# Patient Record
Sex: Female | Born: 1960 | Race: White | Hispanic: No | Marital: Married | Smoking: Never smoker
Health system: Southern US, Community
[De-identification: ages and names within clinical notes are randomized; demographics above are authoritative.]

## PROBLEM LIST (undated history)

## (undated) DIAGNOSIS — K219 Gastro-esophageal reflux disease without esophagitis: Secondary | ICD-10-CM

## (undated) DIAGNOSIS — L309 Dermatitis, unspecified: Secondary | ICD-10-CM

## (undated) DIAGNOSIS — M1990 Unspecified osteoarthritis, unspecified site: Secondary | ICD-10-CM

## (undated) DIAGNOSIS — J3089 Other allergic rhinitis: Secondary | ICD-10-CM

## (undated) DIAGNOSIS — D649 Anemia, unspecified: Secondary | ICD-10-CM

## (undated) DIAGNOSIS — F32A Depression, unspecified: Secondary | ICD-10-CM

## (undated) DIAGNOSIS — Z9889 Other specified postprocedural states: Secondary | ICD-10-CM

## (undated) DIAGNOSIS — I10 Essential (primary) hypertension: Secondary | ICD-10-CM

## (undated) DIAGNOSIS — F419 Anxiety disorder, unspecified: Secondary | ICD-10-CM

## (undated) DIAGNOSIS — R232 Flushing: Secondary | ICD-10-CM

## (undated) DIAGNOSIS — R112 Nausea with vomiting, unspecified: Secondary | ICD-10-CM

## (undated) DIAGNOSIS — F329 Major depressive disorder, single episode, unspecified: Secondary | ICD-10-CM

## (undated) DIAGNOSIS — E119 Type 2 diabetes mellitus without complications: Secondary | ICD-10-CM

## (undated) HISTORY — PX: VAGINAL HYSTERECTOMY: SUR661

## (undated) HISTORY — PX: COLONOSCOPY: SHX174

## (undated) HISTORY — PX: EYE SURGERY: SHX253

## (undated) HISTORY — PX: BACK SURGERY: SHX140

---

## 2000-11-08 ENCOUNTER — Other Ambulatory Visit: Admission: RE | Admit: 2000-11-08 | Discharge: 2000-11-08 | Payer: Self-pay | Admitting: Family Medicine

## 2003-11-25 ENCOUNTER — Inpatient Hospital Stay (HOSPITAL_COMMUNITY): Admission: RE | Admit: 2003-11-25 | Discharge: 2003-11-26 | Payer: Self-pay | Admitting: Neurosurgery

## 2004-01-10 ENCOUNTER — Encounter: Admission: RE | Admit: 2004-01-10 | Discharge: 2004-01-10 | Payer: Self-pay | Admitting: Neurosurgery

## 2004-02-28 ENCOUNTER — Encounter: Admission: RE | Admit: 2004-02-28 | Discharge: 2004-02-28 | Payer: Self-pay | Admitting: Neurosurgery

## 2004-06-29 ENCOUNTER — Ambulatory Visit: Payer: Self-pay | Admitting: Family Medicine

## 2005-11-21 ENCOUNTER — Ambulatory Visit: Payer: Self-pay | Admitting: Family Medicine

## 2008-08-25 LAB — HM PAP SMEAR: HM PAP: NORMAL

## 2008-09-08 ENCOUNTER — Ambulatory Visit: Payer: Self-pay | Admitting: Family Medicine

## 2008-11-29 ENCOUNTER — Ambulatory Visit: Payer: Self-pay | Admitting: Unknown Physician Specialty

## 2008-12-16 ENCOUNTER — Ambulatory Visit: Payer: Self-pay | Admitting: Unknown Physician Specialty

## 2009-10-27 ENCOUNTER — Ambulatory Visit: Payer: Self-pay | Admitting: Family Medicine

## 2009-10-27 LAB — HM MAMMOGRAPHY: HM MAMMO: NORMAL

## 2011-05-01 ENCOUNTER — Ambulatory Visit: Payer: Self-pay | Admitting: Unknown Physician Specialty

## 2012-07-07 LAB — HM COLONOSCOPY: HM COLON: NORMAL

## 2014-04-21 ENCOUNTER — Ambulatory Visit: Payer: Self-pay | Admitting: Family Medicine

## 2014-05-28 ENCOUNTER — Ambulatory Visit: Payer: Self-pay | Admitting: Neurosurgery

## 2014-05-28 LAB — CREATININE, SERUM
Creatinine: 0.97 mg/dL (ref 0.60–1.30)
EGFR (African American): >60
EGFR (Non-African Amer.): >60

## 2014-06-08 ENCOUNTER — Other Ambulatory Visit: Payer: Self-pay | Admitting: Neurosurgery

## 2014-06-08 ENCOUNTER — Ambulatory Visit
Admission: RE | Admit: 2014-06-08 | Discharge: 2014-06-08 | Disposition: A | Discharge status: Home / Self Care | Payer: BLUE CROSS/BLUE SHIELD | Source: Ambulatory Visit | Attending: Neurosurgery | Admitting: Neurosurgery

## 2014-06-08 DIAGNOSIS — M5416 Radiculopathy, lumbar region: Secondary | ICD-10-CM

## 2014-06-08 MED ORDER — METHYLPREDNISOLONE ACETATE 40 MG/ML INJ SUSP (RADIOLOG
120.0000 mg | Freq: Once | INTRAMUSCULAR | Status: AC
  Administered 2014-06-08: 120 mg via EPIDURAL

## 2014-06-08 MED ORDER — IOHEXOL 180 MG/ML  SOLN
1.0000 mL | Freq: Once | INTRAMUSCULAR | Status: AC | PRN
  Administered 2014-06-08: 1 mL via EPIDURAL

## 2014-06-08 NOTE — Discharge Instructions (Signed)

## 2014-07-09 ENCOUNTER — Other Ambulatory Visit: Payer: Self-pay | Admitting: Neurosurgery

## 2014-07-16 ENCOUNTER — Other Ambulatory Visit (HOSPITAL_COMMUNITY): Payer: Self-pay | Admitting: *Deleted

## 2014-07-16 ENCOUNTER — Encounter (HOSPITAL_COMMUNITY): Payer: Self-pay

## 2014-07-16 ENCOUNTER — Encounter (HOSPITAL_COMMUNITY)
Admission: RE | Admit: 2014-07-16 | Discharge: 2014-07-16 | Disposition: A | Discharge status: Home / Self Care | Payer: BLUE CROSS/BLUE SHIELD | Source: Ambulatory Visit | Attending: Neurosurgery | Admitting: Neurosurgery

## 2014-07-16 DIAGNOSIS — Z79899 Other long term (current) drug therapy: Secondary | ICD-10-CM | POA: Diagnosis not present

## 2014-07-16 DIAGNOSIS — I10 Essential (primary) hypertension: Secondary | ICD-10-CM | POA: Diagnosis not present

## 2014-07-16 DIAGNOSIS — Z01810 Encounter for preprocedural cardiovascular examination: Secondary | ICD-10-CM | POA: Insufficient documentation

## 2014-07-16 DIAGNOSIS — Z01818 Encounter for other preprocedural examination: Secondary | ICD-10-CM | POA: Insufficient documentation

## 2014-07-16 DIAGNOSIS — Z981 Arthrodesis status: Secondary | ICD-10-CM | POA: Diagnosis not present

## 2014-07-16 DIAGNOSIS — Z01812 Encounter for preprocedural laboratory examination: Secondary | ICD-10-CM | POA: Insufficient documentation

## 2014-07-16 DIAGNOSIS — M4800 Spinal stenosis, site unspecified: Secondary | ICD-10-CM | POA: Diagnosis not present

## 2014-07-16 DIAGNOSIS — Z0183 Encounter for blood typing: Secondary | ICD-10-CM | POA: Diagnosis not present

## 2014-07-16 DIAGNOSIS — K219 Gastro-esophageal reflux disease without esophagitis: Secondary | ICD-10-CM | POA: Diagnosis not present

## 2014-07-16 HISTORY — DX: Dermatitis, unspecified: L30.9

## 2014-07-16 HISTORY — DX: Major depressive disorder, single episode, unspecified: F32.9

## 2014-07-16 HISTORY — DX: Nausea with vomiting, unspecified: R11.2

## 2014-07-16 HISTORY — DX: Essential (primary) hypertension: I10

## 2014-07-16 HISTORY — DX: Other allergic rhinitis: J30.89

## 2014-07-16 HISTORY — DX: Anxiety disorder, unspecified: F41.9

## 2014-07-16 HISTORY — DX: Anemia, unspecified: D64.9

## 2014-07-16 HISTORY — DX: Unspecified osteoarthritis, unspecified site: M19.90

## 2014-07-16 HISTORY — DX: Depression, unspecified: F32.A

## 2014-07-16 HISTORY — DX: Gastro-esophageal reflux disease without esophagitis: K21.9

## 2014-07-16 HISTORY — DX: Other specified postprocedural states: Z98.890

## 2014-07-16 LAB — BASIC METABOLIC PANEL
Anion gap: 10 (ref 5–15)
BUN: 13 mg/dL (ref 6–23)
CHLORIDE: 100 mmol/L (ref 96–112)
CO2: 28 mmol/L (ref 19–32)
Calcium: 9.1 mg/dL (ref 8.4–10.5)
Creatinine, Ser: 0.89 mg/dL (ref 0.50–1.10)
GFR, EST AFRICAN AMERICAN: 84 mL/min — AB (ref >90)
GFR, EST NON AFRICAN AMERICAN: 73 mL/min — AB (ref >90)
Glucose, Bld: 127 mg/dL — ABNORMAL HIGH (ref 70–99)
Potassium: 3.1 mmol/L — ABNORMAL LOW (ref 3.5–5.1)
Sodium: 138 mmol/L (ref 135–145)

## 2014-07-16 LAB — CBC
HCT: 40.4 % (ref 36.0–46.0)
HEMOGLOBIN: 13.7 g/dL (ref 12.0–15.0)
MCH: 28.9 pg (ref 26.0–34.0)
MCHC: 33.9 g/dL (ref 30.0–36.0)
MCV: 85.2 fL (ref 78.0–100.0)
Platelets: 233 10*3/uL (ref 150–400)
RBC: 4.74 MIL/uL (ref 3.87–5.11)
RDW: 13.3 % (ref 11.5–15.5)
WBC: 8.5 10*3/uL (ref 4.0–10.5)

## 2014-07-16 LAB — SURGICAL PCR SCREEN
MRSA, PCR: NEGATIVE
Staphylococcus aureus: NEGATIVE

## 2014-07-16 LAB — TYPE AND SCREEN
ABO/RH(D): A NEG
Antibody Screen: NEGATIVE

## 2014-07-16 LAB — ABO/RH: ABO/RH(D): A NEG

## 2014-07-16 NOTE — Pre-Procedure Instructions (Signed)
Cheryl Yang  07/16/2014   Your procedure is scheduled on:  Tuesday, July 20, 2014 at 7:30 AM.   Report to [facility] Entrance "A" Admitting  at 5:30 AM.   Call this number if you have problems the morning of surgery: [phone]               Any questions prior to day of surgery, please call [phone] between 8 & 4 PM.   Remember:   Do not eat food or drink liquids after midnight Monday, 07/19/14.   Take these medicines the morning of surgery with A SIP OF WATER: Fexofenadine (Allegra), Gabapentin (Neurontin), Sertraline (Zoloft), Omeprezole (Prilosec) - if needed   Stop CoQ10 as of today.   Do not wear jewelry, make-up or nail polish.  Do not wear lotions, powders, or perfumes. You may wear deodorant.  Do not shave 48 hours prior to surgery.   Do not bring valuables to the hospital.  [facility] is not responsible                  for any belongings or valuables.               Contacts, dentures or bridgework may not be worn into surgery.  Leave suitcase in the car. After surgery it may be brought to your room.  For patients admitted to the hospital, discharge time is determined by your                treatment team.               Special Instructions: [place] - Preparing for Surgery  Before surgery, you can play an important role.  Because skin is not sterile, your skin needs to be as free of germs as possible.  You can reduce the number of germs on you skin by washing with CHG (chlorahexidine gluconate) soap before surgery.  CHG is an antiseptic cleaner which kills germs and bonds with the skin to continue killing germs even after washing.  Please DO NOT use if you have an allergy to CHG or antibacterial soaps.  If your skin becomes reddened/irritated stop using the CHG and inform your nurse when you arrive at Short Stay.  Do not shave (including legs and underarms) for at least 48 hours prior to the first CHG shower.  You may shave your face.  Please follow  these instructions carefully:   1.  Shower with CHG Soap the night before surgery and the                                morning of Surgery.  2.  If you choose to wash your hair, wash your hair first as usual with your       normal shampoo.  3.  After you shampoo, rinse your hair and body thoroughly to remove the                      Shampoo.  4.  Use CHG as you would any other liquid soap.  You can apply chg directly       to the skin and wash gently with scrungie or a clean washcloth.  5.  Apply the CHG Soap to your body ONLY FROM THE NECK DOWN.        Do not use on open wounds or open sores.  Avoid contact with  your eyes, ears, mouth and genitals (private parts).  Wash genitals (private parts) with your normal soap.  6.  Wash thoroughly, paying special attention to the area where your surgery        will be performed.  7.  Thoroughly rinse your body with warm water from the neck down.  8.  DO NOT shower/wash with your normal soap after using and rinsing off       the CHG Soap.  9.  Pat yourself dry with a clean towel.            10.  Wear clean pajamas.            11.  Place clean sheets on your bed the night of your first shower and do not        sleep with pets.  Day of Surgery  Do not apply any lotions the morning of surgery.  Please wear clean clothes to the hospital.     Please read over the following fact sheets that you were given: Pain Booklet, Coughing and Deep Breathing, Blood Transfusion Information, MRSA Information and Surgical Site Infection Prevention

## 2014-07-16 NOTE — Progress Notes (Signed)
Pt was tearful during PAT appt due to severe pain. She states she had been told to stop her anti-inflammatories and doesn't have any other pain meds, states Tylenol does not help. I suggested that she call Dr. Sande Rives office and see if there is any other medicine that he could call in for her. She stated that she would try that.   Stop Bang Assessment sent to Dr. Rosanna Randy, pt's PCP.

## 2014-07-16 NOTE — Progress Notes (Signed)
   07/16/14 1403  [place]  Have you ever been diagnosed with sleep apnea through a sleep study? No  Do you snore loudly (loud enough to be heard through closed doors)?  1  Do you often feel tired, fatigued, or sleepy during the daytime? 0  Has anyone observed you stop breathing during your sleep? 0  Do you have, or are you being treated for high blood pressure? 1  BMI more than 35 kg/m2? 1  Age over 54 years old? 1  Neck circumference greater than 40 cm/16 inches? 1  Gender: 0

## 2014-07-19 MED ORDER — DEXAMETHASONE SODIUM PHOSPHATE 10 MG/ML IJ SOLN
10.0000 mg | INTRAMUSCULAR | Status: AC
  Administered 2014-07-20: 10 mg via INTRAVENOUS
  Filled 2014-07-19: qty 1

## 2014-07-19 MED ORDER — CEFAZOLIN SODIUM-DEXTROSE 2-3 GM-% IV SOLR
2.0000 g | INTRAVENOUS | Status: AC
  Administered 2014-07-20: 2 g via INTRAVENOUS
  Filled 2014-07-19: qty 50

## 2014-07-20 ENCOUNTER — Inpatient Hospital Stay (HOSPITAL_COMMUNITY): Payer: BLUE CROSS/BLUE SHIELD

## 2014-07-20 ENCOUNTER — Inpatient Hospital Stay (HOSPITAL_COMMUNITY)
Admission: RE | Admit: 2014-07-20 | Discharge: 2014-07-23 | DRG: 460 | Disposition: A | Discharge status: Home / Self Care | Payer: BLUE CROSS/BLUE SHIELD | Source: Ambulatory Visit | Attending: Neurosurgery | Admitting: Neurosurgery

## 2014-07-20 ENCOUNTER — Inpatient Hospital Stay (HOSPITAL_COMMUNITY): Payer: BLUE CROSS/BLUE SHIELD | Admitting: Certified Registered Nurse Anesthetist

## 2014-07-20 ENCOUNTER — Encounter (HOSPITAL_COMMUNITY)
Admission: RE | Disposition: A | Discharge status: Home / Self Care | Payer: Self-pay | Source: Ambulatory Visit | Attending: Neurosurgery

## 2014-07-20 ENCOUNTER — Encounter (HOSPITAL_COMMUNITY): Payer: Self-pay | Admitting: *Deleted

## 2014-07-20 DIAGNOSIS — M549 Dorsalgia, unspecified: Secondary | ICD-10-CM | POA: Diagnosis present

## 2014-07-20 DIAGNOSIS — K219 Gastro-esophageal reflux disease without esophagitis: Secondary | ICD-10-CM | POA: Diagnosis present

## 2014-07-20 DIAGNOSIS — M4326 Fusion of spine, lumbar region: Secondary | ICD-10-CM

## 2014-07-20 DIAGNOSIS — I10 Essential (primary) hypertension: Secondary | ICD-10-CM | POA: Diagnosis present

## 2014-07-20 DIAGNOSIS — Z6841 Body Mass Index (BMI) 40.0 and over, adult: Secondary | ICD-10-CM | POA: Diagnosis not present

## 2014-07-20 DIAGNOSIS — M4806 Spinal stenosis, lumbar region: Principal | ICD-10-CM | POA: Diagnosis present

## 2014-07-20 DIAGNOSIS — F329 Major depressive disorder, single episode, unspecified: Secondary | ICD-10-CM | POA: Diagnosis present

## 2014-07-20 DIAGNOSIS — F419 Anxiety disorder, unspecified: Secondary | ICD-10-CM | POA: Diagnosis present

## 2014-07-20 DIAGNOSIS — M48061 Spinal stenosis, lumbar region without neurogenic claudication: Secondary | ICD-10-CM | POA: Diagnosis present

## 2014-07-20 SURGERY — POSTERIOR LUMBAR FUSION 1 WITH HARDWARE REMOVAL
Anesthesia: General | Site: Back

## 2014-07-20 MED ORDER — SODIUM CHLORIDE 0.9 % IR SOLN
Status: DC | PRN
  Administered 2014-07-20: 500 mL

## 2014-07-20 MED ORDER — ONDANSETRON HCL 4 MG/2ML IJ SOLN
INTRAMUSCULAR | Status: AC
  Filled 2014-07-20: qty 2

## 2014-07-20 MED ORDER — PHENOL 1.4 % MT LIQD: 1.0000 | OROMUCOSAL | Status: DC | PRN

## 2014-07-20 MED ORDER — ONDANSETRON HCL 4 MG/2ML IJ SOLN
INTRAMUSCULAR | Status: DC | PRN
  Administered 2014-07-20: 4 mg via INTRAVENOUS

## 2014-07-20 MED ORDER — HYDROCHLOROTHIAZIDE 12.5 MG PO CAPS
12.5000 mg | ORAL_CAPSULE | Freq: Every day | ORAL | Status: DC
  Administered 2014-07-22 – 2014-07-23 (×2): 12.5 mg via ORAL
  Filled 2014-07-20 (×3): qty 1

## 2014-07-20 MED ORDER — MENTHOL 3 MG MT LOZG: 1.0000 | LOZENGE | OROMUCOSAL | Status: DC | PRN

## 2014-07-20 MED ORDER — CYCLOBENZAPRINE HCL 10 MG PO TABS
10.0000 mg | ORAL_TABLET | Freq: Three times a day (TID) | ORAL | Status: DC | PRN
  Administered 2014-07-20 – 2014-07-21 (×3): 10 mg via ORAL
  Filled 2014-07-20 (×3): qty 1

## 2014-07-20 MED ORDER — DOCUSATE SODIUM 100 MG PO CAPS
100.0000 mg | ORAL_CAPSULE | Freq: Two times a day (BID) | ORAL | Status: DC
  Administered 2014-07-20 – 2014-07-23 (×7): 100 mg via ORAL
  Filled 2014-07-20 (×7): qty 1

## 2014-07-20 MED ORDER — ACETAMINOPHEN 650 MG RE SUPP: 650.0000 mg | RECTAL | Status: DC | PRN

## 2014-07-20 MED ORDER — CYCLOBENZAPRINE HCL 10 MG PO TABS
ORAL_TABLET | ORAL | Status: AC
  Filled 2014-07-20: qty 1

## 2014-07-20 MED ORDER — DEXAMETHASONE 4 MG PO TABS
4.0000 mg | ORAL_TABLET | Freq: Four times a day (QID) | ORAL | Status: AC
  Administered 2014-07-20 (×2): 4 mg via ORAL
  Filled 2014-07-20 (×2): qty 1

## 2014-07-20 MED ORDER — ZOLPIDEM TARTRATE 5 MG PO TABS: 5.0000 mg | ORAL_TABLET | Freq: Every evening | ORAL | Status: DC | PRN

## 2014-07-20 MED ORDER — FENTANYL CITRATE 0.05 MG/ML IJ SOLN
INTRAMUSCULAR | Status: AC
  Filled 2014-07-20: qty 5

## 2014-07-20 MED ORDER — ROCURONIUM BROMIDE 50 MG/5ML IV SOLN
INTRAVENOUS | Status: AC
  Filled 2014-07-20: qty 1

## 2014-07-20 MED ORDER — OXYCODONE-ACETAMINOPHEN 5-325 MG PO TABS
ORAL_TABLET | ORAL | Status: AC
  Filled 2014-07-20: qty 2

## 2014-07-20 MED ORDER — IRBESARTAN 150 MG PO TABS
150.0000 mg | ORAL_TABLET | Freq: Every day | ORAL | Status: DC
  Administered 2014-07-23: 150 mg via ORAL
  Filled 2014-07-20 (×3): qty 1

## 2014-07-20 MED ORDER — SODIUM CHLORIDE 0.9 % IJ SOLN: 3.0000 mL | INTRAMUSCULAR | Status: DC | PRN

## 2014-07-20 MED ORDER — MEPERIDINE HCL 25 MG/ML IJ SOLN: 6.2500 mg | INTRAMUSCULAR | Status: DC | PRN

## 2014-07-20 MED ORDER — SODIUM CHLORIDE 0.9 % IV SOLN: 250.0000 mL | INTRAVENOUS | Status: DC

## 2014-07-20 MED ORDER — VANCOMYCIN HCL 1000 MG IV SOLR
INTRAVENOUS | Status: DC | PRN
  Administered 2014-07-20: 1000 mg

## 2014-07-20 MED ORDER — BUPIVACAINE LIPOSOME 1.3 % IJ SUSP
20.0000 mL | Freq: Once | INTRAMUSCULAR | Status: DC
  Filled 2014-07-20: qty 20

## 2014-07-20 MED ORDER — ONDANSETRON HCL 4 MG/2ML IJ SOLN: 4.0000 mg | INTRAMUSCULAR | Status: DC | PRN

## 2014-07-20 MED ORDER — DIPHENHYDRAMINE HCL 50 MG/ML IJ SOLN: 10.0000 mg | Freq: Once | INTRAMUSCULAR | Status: DC

## 2014-07-20 MED ORDER — GLYCOPYRROLATE 0.2 MG/ML IJ SOLN
INTRAMUSCULAR | Status: AC
  Filled 2014-07-20: qty 4

## 2014-07-20 MED ORDER — EPHEDRINE SULFATE 50 MG/ML IJ SOLN
INTRAMUSCULAR | Status: DC | PRN
  Administered 2014-07-20: 15 mg via INTRAVENOUS
  Administered 2014-07-20: 10 mg via INTRAVENOUS

## 2014-07-20 MED ORDER — FENTANYL CITRATE 0.05 MG/ML IJ SOLN
INTRAMUSCULAR | Status: DC | PRN
Start: 2014-07-20 — End: 2014-07-20
  Administered 2014-07-20 (×3): 50 ug via INTRAVENOUS
  Administered 2014-07-20: 250 ug via INTRAVENOUS

## 2014-07-20 MED ORDER — LIDOCAINE HCL (CARDIAC) 20 MG/ML IV SOLN
INTRAVENOUS | Status: AC
  Filled 2014-07-20: qty 5

## 2014-07-20 MED ORDER — PHENYLEPHRINE 40 MCG/ML (10ML) SYRINGE FOR IV PUSH (FOR BLOOD PRESSURE SUPPORT)
PREFILLED_SYRINGE | INTRAVENOUS | Status: AC
  Filled 2014-07-20: qty 10

## 2014-07-20 MED ORDER — ACETAMINOPHEN 325 MG PO TABS: 650.0000 mg | ORAL_TABLET | ORAL | Status: DC | PRN

## 2014-07-20 MED ORDER — SERTRALINE HCL 50 MG PO TABS
50.0000 mg | ORAL_TABLET | Freq: Every day | ORAL | Status: DC
  Administered 2014-07-20 – 2014-07-23 (×4): 50 mg via ORAL
  Filled 2014-07-20 (×4): qty 1

## 2014-07-20 MED ORDER — LACTATED RINGERS IV SOLN
INTRAVENOUS | Status: DC | PRN
  Administered 2014-07-20 (×4): via INTRAVENOUS

## 2014-07-20 MED ORDER — SODIUM CHLORIDE 0.9 % IJ SOLN
INTRAMUSCULAR | Status: DC | PRN
  Administered 2014-07-20: 20 mL via INTRAVENOUS

## 2014-07-20 MED ORDER — LIDOCAINE HCL (CARDIAC) 20 MG/ML IV SOLN
INTRAVENOUS | Status: DC | PRN
  Administered 2014-07-20: 20 mg via INTRAVENOUS

## 2014-07-20 MED ORDER — HYDROMORPHONE HCL 1 MG/ML IJ SOLN
0.2500 mg | INTRAMUSCULAR | Status: DC | PRN
  Administered 2014-07-20: 0.5 mg via INTRAVENOUS

## 2014-07-20 MED ORDER — THROMBIN 20000 UNITS EX SOLR
CUTANEOUS | Status: DC | PRN
  Administered 2014-07-20: 20 mL via TOPICAL

## 2014-07-20 MED ORDER — SODIUM CHLORIDE 0.9 % IJ SOLN
INTRAMUSCULAR | Status: AC
  Filled 2014-07-20: qty 10

## 2014-07-20 MED ORDER — PANTOPRAZOLE SODIUM 40 MG PO TBEC
40.0000 mg | DELAYED_RELEASE_TABLET | Freq: Every day | ORAL | Status: DC
  Administered 2014-07-20 – 2014-07-23 (×4): 40 mg via ORAL
  Filled 2014-07-20 (×4): qty 1

## 2014-07-20 MED ORDER — MIDAZOLAM HCL 2 MG/2ML IJ SOLN: 0.5000 mg | Freq: Once | INTRAMUSCULAR | Status: DC | PRN

## 2014-07-20 MED ORDER — DEXAMETHASONE SODIUM PHOSPHATE 4 MG/ML IJ SOLN: 4.0000 mg | Freq: Four times a day (QID) | INTRAMUSCULAR | Status: AC

## 2014-07-20 MED ORDER — PROPOFOL 10 MG/ML IV BOLUS
INTRAVENOUS | Status: AC
  Filled 2014-07-20: qty 20

## 2014-07-20 MED ORDER — OLMESARTAN MEDOXOMIL-HCTZ 20-12.5 MG PO TABS: 1.0000 | ORAL_TABLET | Freq: Every day | ORAL | Status: DC

## 2014-07-20 MED ORDER — HYDROMORPHONE HCL 1 MG/ML IJ SOLN
1.0000 mg | INTRAMUSCULAR | Status: DC | PRN
  Administered 2014-07-20 – 2014-07-22 (×2): 1 mg via INTRAMUSCULAR
  Filled 2014-07-20 (×2): qty 1

## 2014-07-20 MED ORDER — BUPIVACAINE HCL 0.5 % IJ SOLN
INTRAMUSCULAR | Status: DC | PRN
  Administered 2014-07-20: 20 mL

## 2014-07-20 MED ORDER — HYDROMORPHONE HCL 1 MG/ML IJ SOLN
INTRAMUSCULAR | Status: AC
  Administered 2014-07-20: 0.5 mg via INTRAVENOUS
  Filled 2014-07-20: qty 1

## 2014-07-20 MED ORDER — ROCURONIUM BROMIDE 100 MG/10ML IV SOLN
INTRAVENOUS | Status: DC | PRN
  Administered 2014-07-20: 50 mg via INTRAVENOUS
  Administered 2014-07-20: 20 mg via INTRAVENOUS
  Administered 2014-07-20 (×3): 10 mg via INTRAVENOUS

## 2014-07-20 MED ORDER — VANCOMYCIN HCL 1000 MG IV SOLR
INTRAVENOUS | Status: AC
  Filled 2014-07-20: qty 1000

## 2014-07-20 MED ORDER — CEFAZOLIN SODIUM-DEXTROSE 2-3 GM-% IV SOLR
2.0000 g | Freq: Three times a day (TID) | INTRAVENOUS | Status: AC
  Administered 2014-07-20 (×2): 2 g via INTRAVENOUS
  Filled 2014-07-20 (×2): qty 50

## 2014-07-20 MED ORDER — NEOSTIGMINE METHYLSULFATE 10 MG/10ML IV SOLN
INTRAVENOUS | Status: DC | PRN
  Administered 2014-07-20: 4 mg via INTRAVENOUS

## 2014-07-20 MED ORDER — MIDAZOLAM HCL 5 MG/5ML IJ SOLN
INTRAMUSCULAR | Status: DC | PRN
  Administered 2014-07-20: 2 mg via INTRAVENOUS

## 2014-07-20 MED ORDER — GABAPENTIN 400 MG PO CAPS
400.0000 mg | ORAL_CAPSULE | Freq: Two times a day (BID) | ORAL | Status: DC
  Administered 2014-07-20 – 2014-07-23 (×6): 400 mg via ORAL
  Filled 2014-07-20 (×7): qty 1

## 2014-07-20 MED ORDER — GLYCOPYRROLATE 0.2 MG/ML IJ SOLN
INTRAMUSCULAR | Status: DC | PRN
  Administered 2014-07-20: 0.6 mg via INTRAVENOUS

## 2014-07-20 MED ORDER — SUCCINYLCHOLINE CHLORIDE 20 MG/ML IJ SOLN
INTRAMUSCULAR | Status: AC
  Filled 2014-07-20: qty 1

## 2014-07-20 MED ORDER — PANTOPRAZOLE SODIUM 40 MG IV SOLR
40.0000 mg | Freq: Every day | INTRAVENOUS | Status: DC
  Filled 2014-07-20: qty 40

## 2014-07-20 MED ORDER — PROMETHAZINE HCL 25 MG/ML IJ SOLN: 6.2500 mg | INTRAMUSCULAR | Status: DC | PRN

## 2014-07-20 MED ORDER — MIDAZOLAM HCL 2 MG/2ML IJ SOLN
INTRAMUSCULAR | Status: AC
  Filled 2014-07-20: qty 2

## 2014-07-20 MED ORDER — OXYCODONE-ACETAMINOPHEN 5-325 MG PO TABS
1.0000 | ORAL_TABLET | ORAL | Status: DC | PRN
  Administered 2014-07-20 – 2014-07-23 (×14): 2 via ORAL
  Filled 2014-07-20 (×13): qty 2

## 2014-07-20 MED ORDER — PROPOFOL 10 MG/ML IV BOLUS
INTRAVENOUS | Status: DC | PRN
  Administered 2014-07-20: 150 mg via INTRAVENOUS

## 2014-07-20 MED ORDER — KCL IN DEXTROSE-NACL 20-5-0.45 MEQ/L-%-% IV SOLN
80.0000 mL/h | INTRAVENOUS | Status: DC
  Filled 2014-07-20 (×7): qty 1000

## 2014-07-20 MED ORDER — SCOPOLAMINE 1 MG/3DAYS TD PT72
MEDICATED_PATCH | TRANSDERMAL | Status: AC
  Administered 2014-07-20: 1 via TRANSDERMAL
  Filled 2014-07-20: qty 1

## 2014-07-20 MED ORDER — SODIUM CHLORIDE 0.9 % IJ SOLN: 3.0000 mL | Freq: Two times a day (BID) | INTRAMUSCULAR | Status: DC

## 2014-07-20 MED ORDER — BUPIVACAINE LIPOSOME 1.3 % IJ SUSP
INTRAMUSCULAR | Status: DC | PRN
  Administered 2014-07-20: 20 mL

## 2014-07-20 MED ORDER — 0.9 % SODIUM CHLORIDE (POUR BTL) OPTIME
TOPICAL | Status: DC | PRN
  Administered 2014-07-20: 1000 mL

## 2014-07-20 MED ORDER — EPHEDRINE SULFATE 50 MG/ML IJ SOLN
INTRAMUSCULAR | Status: AC
  Filled 2014-07-20: qty 1

## 2014-07-20 SURGICAL SUPPLY — 67 items
BAG DECANTER FOR FLEXI CONT (MISCELLANEOUS) ×2 IMPLANT
BENZOIN TINCTURE PRP APPL 2/3 (GAUZE/BANDAGES/DRESSINGS) ×2 IMPLANT
BLADE CLIPPER SURG (BLADE) ×2 IMPLANT
BONE EQUIVA 10CC (Bone Implant) ×2 IMPLANT
BRUSH SCRUB EZ PLAIN DRY (MISCELLANEOUS) ×2 IMPLANT
BUR CUTTER 7.0 ROUND (BURR) ×4 IMPLANT
BUR MATCHSTICK NEURO 3.0 LAGG (BURR) ×2 IMPLANT
CANISTER SUCT 3000ML PPV (MISCELLANEOUS) ×2 IMPLANT
CONT SPEC 4OZ CLIKSEAL STRL BL (MISCELLANEOUS) ×4 IMPLANT
CORDS BIPOLAR (ELECTRODE) ×2 IMPLANT
COVER BACK TABLE 24X17X13 BIG (DRAPES) IMPLANT
COVER BACK TABLE 60X90IN (DRAPES) ×2 IMPLANT
DRAPE C-ARM 42X72 X-RAY (DRAPES) ×4 IMPLANT
DRAPE LAPAROTOMY 100X72X124 (DRAPES) ×2 IMPLANT
DRAPE SURG 17X23 STRL (DRAPES) ×4 IMPLANT
DRSG OPSITE 4X5.5 SM (GAUZE/BANDAGES/DRESSINGS) ×2 IMPLANT
DRSG OPSITE POSTOP 4X6 (GAUZE/BANDAGES/DRESSINGS) ×2 IMPLANT
DRSG OPSITE POSTOP 4X8 (GAUZE/BANDAGES/DRESSINGS) ×2 IMPLANT
DRSG TELFA 3X8 NADH (GAUZE/BANDAGES/DRESSINGS) ×2 IMPLANT
DURAPREP 26ML APPLICATOR (WOUND CARE) ×2 IMPLANT
ELECT REM PT RETURN 9FT ADLT (ELECTROSURGICAL) ×2
ELECTRODE REM PT RTRN 9FT ADLT (ELECTROSURGICAL) ×1 IMPLANT
EVACUATOR 1/8 PVC DRAIN (DRAIN) ×2 IMPLANT
GAUZE SPONGE 4X4 12PLY STRL (GAUZE/BANDAGES/DRESSINGS) ×2 IMPLANT
GAUZE SPONGE 4X4 16PLY XRAY LF (GAUZE/BANDAGES/DRESSINGS) IMPLANT
GLOVE BIO SURGEON STRL SZ8 (GLOVE) ×2 IMPLANT
GLOVE BIOGEL PI IND STRL 7.0 (GLOVE) ×3 IMPLANT
GLOVE BIOGEL PI IND STRL 8.5 (GLOVE) ×1 IMPLANT
GLOVE BIOGEL PI INDICATOR 7.0 (GLOVE) ×3
GLOVE BIOGEL PI INDICATOR 8.5 (GLOVE) ×1
GLOVE ECLIPSE 8.0 STRL XLNG CF (GLOVE) ×4 IMPLANT
GLOVE SS N UNI LF 7.0 STRL (GLOVE) ×8 IMPLANT
GOWN STRL REUS W/ TWL LRG LVL3 (GOWN DISPOSABLE) IMPLANT
GOWN STRL REUS W/ TWL XL LVL3 (GOWN DISPOSABLE) ×4 IMPLANT
GOWN STRL REUS W/TWL 2XL LVL3 (GOWN DISPOSABLE) IMPLANT
GOWN STRL REUS W/TWL LRG LVL3 (GOWN DISPOSABLE)
GOWN STRL REUS W/TWL XL LVL3 (GOWN DISPOSABLE) ×4
IMPLANT ARDIS PEEK 10X9X26 (Orthopedic Implant) ×4 IMPLANT
KIT BASIN OR (CUSTOM PROCEDURE TRAY) ×2 IMPLANT
KIT ROOM TURNOVER OR (KITS) ×2 IMPLANT
LIQUID BAND (GAUZE/BANDAGES/DRESSINGS) IMPLANT
NEEDLE HYPO 22GX1.5 SAFETY (NEEDLE) ×2 IMPLANT
NS IRRIG 1000ML POUR BTL (IV SOLUTION) ×2 IMPLANT
PACK LAMINECTOMY NEURO (CUSTOM PROCEDURE TRAY) ×2 IMPLANT
PAD ARMBOARD 7.5X6 YLW CONV (MISCELLANEOUS) ×6 IMPLANT
PATTIES SURGICAL .75X.75 (GAUZE/BANDAGES/DRESSINGS) ×2 IMPLANT
PEDIGUARD CURV (INSTRUMENTS) ×2 IMPLANT
ROD PATHFINDER 50MM (Rod) ×2 IMPLANT
ROD PRE BENT PERC 60MM (Rod) ×2 IMPLANT
SCREW PATHFINDER 7.5X40MM (Screw) ×4 IMPLANT
SCREW POLYAXIA MIS 6.5X40MM (Screw) ×4 IMPLANT
SPONGE LAP 4X18 X RAY DECT (DISPOSABLE) ×2 IMPLANT
SPONGE SURGIFOAM ABS GEL 100 (HEMOSTASIS) ×2 IMPLANT
STRIP CLOSURE SKIN 1/2X4 (GAUZE/BANDAGES/DRESSINGS) ×4 IMPLANT
SUT PROLENE 0 CT 1 30 (SUTURE) IMPLANT
SUT VIC AB 0 CT1 18XCR BRD8 (SUTURE) ×1 IMPLANT
SUT VIC AB 0 CT1 8-18 (SUTURE) ×1
SUT VIC AB 2-0 OS6 18 (SUTURE) ×6 IMPLANT
SUT VIC AB 3-0 CP2 18 (SUTURE) ×2 IMPLANT
SYR 20ML ECCENTRIC (SYRINGE) ×2 IMPLANT
TAPE STRIPS DRAPE STRL (GAUZE/BANDAGES/DRESSINGS) ×2 IMPLANT
TOP CLSR SEQUOIA (Orthopedic Implant) ×10 IMPLANT
TOWEL OR 17X24 6PK STRL BLUE (TOWEL DISPOSABLE) ×2 IMPLANT
TOWEL OR 17X26 10 PK STRL BLUE (TOWEL DISPOSABLE) ×2 IMPLANT
TRAP SPECIMEN MUCOUS 40CC (MISCELLANEOUS) IMPLANT
TRAY FOLEY CATH 14FRSI W/METER (CATHETERS) ×2 IMPLANT
WATER STERILE IRR 1000ML POUR (IV SOLUTION) ×2 IMPLANT

## 2014-07-20 NOTE — Op Note (Signed)
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 Postop diagnosis: Same Procedure: Expiration of fusion L3-4 Removal of hardware L3-4 Bilateral L2-3 complete laminectomy with decompression of L2 and L3 nerve roots with relief of central and lateral recess stenosis more so than needed for interbody fusion Bilateral L2-3 microdiscectomy L2-3 posterior lumbar interbody fusion with peek interbody spacer, 10 x 9 x 26 L2-3 posterolateral fusion Nonsegmental instrumentation L2-3 with Pathfinder pedicle screw system Surgeon: Amandalee Lacap Asst.: Vertell Limber  After being placed the prone position the patient's back was prepped and draped in the usual sterile fashion. Previous lumbar incision was opened up and extended superiorly. We carried the incision down to the spinous process of L2 and then did a subperiosteal dissection bilaterally. We then went into the soft tissue bilaterally to identify the old instrumentation at L3-4 which was done without difficulty. We identified the facet joint and transverse process of L2 and placed a self-retaining retractor for exposure. X-ray showed approach the appropriate levels. We removed the top loading nuts from the previous instrumentation and then checked the rigidity fusion and seen to be solidly fused. We then removed the rods. We removed the screws at L4 and left them out. We plugged the screw holes with large wads of bone wax. We then removed the screws at L3 and placed them with larger Pathfinder screws which were 7.5 mm x 40 mm. We then did a thorough decompression of the lamina and facet joint of L2 to decompress the thecal sac and L2 and L3 nerve roots more so than needed for interbody fusion. We removed the medial 75% of the facet joint bilaterally. We then identified the L2-3 disc which was found to be markedly herniated critically centrally. We incised the the annulus and then thoroughly cleaned out the disc space with pituitary  rongeurs and curettes. Thorough disc space cleanout was carried out while the same time great care was taken to avoid injury to the neural elements and this was successfully done. We then prepared the disc for interbody fusion. We distracted up to a 10 mm size which seemed like a good fit. We used a rotating cutter bilaterally. We then thoroughly prepared one side for cage and packed a 10 x 9 x 26 mm cage with a mixture of Vitoss bone and morselized allograft. We impacted without difficulty and fossae showed to be in good position. On the opposite side we did a similar preparation. Prior to placing the second cage we placed a mixture of autologous bone morselized allograft deep within the interspace to help with the interbody fusion. We then placed pedicle screws at L2 bilaterally in standard fashion. The was a drill hole entry point passed the ultrasonic guided pedicle all. We tapped with the 6 mm tap and then placed 6.5 mm x 40 mm screws bilaterally. Fossae showed them to be in good position. We chose appropriate length rods. Prior to placing the rods we decorticated the far lateral region and placed a mixture of autologous bone and morselized allograft for posterolateral fusion at L2-3. We then chose appropriate length length rod secured them to the top the screws used top loading nuts. We did tightening and final tightening with torque and counter torque and final fluoroscopy in AP lateral direction looked good. We irrigated the wound copiously controlled any bleeding with upper coagulation Gelfoam. We left an epidural drain in the epidural space and brought out through a separate stab wound incision. We then closed the wound in  multiple layers of Vicryl on the fascia subcutaneous and subcuticular tissues. We then did a running locking Prolene on the skin. A sterile dressing was then applied and the patient was extubated and taken to recovery room in stable condition.

## 2014-07-20 NOTE — Transfer of Care (Signed)
Immediate Anesthesia Transfer of Care Note  Patient: Cheryl Yang  Procedure(s) Performed: Procedure(s) with comments: POSTERIOR LUMBAR FUSION 1 WITH HARDWARE REMOVAL (N/A) - POSTERIOR LUMBAR FUSION 1 LUMBAR 2-3 WITH HARDWARE REMOVAL  Patient Location: PACU  Anesthesia Type:General  Level of Consciousness: sedated  Airway & Oxygen Therapy: Patient Spontanous Breathing and Patient connected to nasal cannula oxygen  Post-op Assessment: Report given to RN, Post -op Vital signs reviewed and stable and Patient moving all extremities X 4  Post vital signs: Reviewed and stable  Last Vitals:  Filed Vitals:   07/20/14 0636  BP: 142/64  Pulse: 70  Temp: 36.6 C  Resp: 18    Complications: No apparent anesthesia complications

## 2014-07-20 NOTE — Anesthesia Postprocedure Evaluation (Signed)
  Anesthesia Post-op Note  Patient: Cheryl Yang  Procedure(s) Performed: Procedure(s) with comments: POSTERIOR LUMBAR FUSION 1 WITH HARDWARE REMOVAL (N/A) - POSTERIOR LUMBAR FUSION 1 LUMBAR 2-3 WITH HARDWARE REMOVAL  Patient Location: PACU  Anesthesia Type:General  Level of Consciousness: sedated, patient cooperative and responds to stimulation and voice  Airway and Oxygen Therapy: Patient Spontanous Breathing and Patient connected to nasal cannula oxygen  Post-op Pain: none  Post-op Assessment: Post-op Vital signs reviewed, Patient's Cardiovascular Status Stable, Respiratory Function Stable, Patent Airway, No signs of Nausea or vomiting and Pain level controlled  Post-op Vital Signs: Reviewed and stable  Last Vitals:  Filed Vitals:   07/20/14 1345  BP: 127/67  Pulse: 81  Temp: 37.1 C  Resp: 20    Complications: No apparent anesthesia complications

## 2014-07-20 NOTE — H&P (Signed)
Cheryl Yang is an 54 y.o. female.   Chief Complaint: Back pain into the legs HPI: The patient is a 54 year old female who had a back fusion a number of years ago. She did well at that time. She reportedly slipped in the shower about 9 months ago is had difficulty with her back with pain going down her leg with numbness and some weakness of the right leg. She saw her medical doctor and orthopedist. Plan x-rays were taken and an MRI scan. She's tried on meloxicam Naprosyn Neurontin Voltaren all without relief. She was evaluated in the office in January and underwent a repeat MRI scan. The showed good fusion at L3-4 with adjacent level disease at L2-3. After failing further conservative therapy the patient requested surgery and now comes for extension of her fusion to L2-3. I've had a long discussion with her regarding the risks and benefits of surgical intervention. The risks discussed include but are not limited to bleeding infection weakness some as paralysis spinal fluid leak trouble with instrumentation nonunion coma and death. We have discussed alternative methods of therapy offered risks and benefits of nonintervention. She's had the opportunity to ask numerous questions and appears to understand. With information in hand she has requested that we proceed with surgery.  Past Medical History  Diagnosis Date  . Hypertension   . Anxiety   . Depression   . GERD (gastroesophageal reflux disease)     occ, uses Prilosec as needed with anti-imflammatories  . Arthritis   . Anemia     due to fibroids (has had hysterectomy)  . Environmental and seasonal allergies   . Eczema   . PONV (postoperative nausea and vomiting)     Past Surgical History  Procedure Laterality Date  . Eye surgery Left     had hole in eye repaired  . Back surgery      lumbar surgery  . Colonoscopy    . Vaginal hysterectomy      Family History  Problem Relation Age of Onset  . Heart attack Mother   . CVA Mother     Social History:  reports that she has never smoked. She has never used smokeless tobacco. She reports that she drinks alcohol. She reports that she does not use illicit drugs.  Allergies:  Allergies  Allergen Reactions  . Salmon [Fish Allergy] Itching  . Seldane [Terfenadine] Rash  . Tagamet [Cimetidine] Itching and Rash    Medications Prior to Admission  Medication Sig Dispense Refill  . Coenzyme Q10 (CO Q10) 100 MG CAPS Take 200 mg by mouth daily.    . diclofenac (VOLTAREN) 75 MG EC tablet Take 75 mg by mouth 2 (two) times daily.    . fexofenadine (ALLEGRA) 180 MG tablet Take 180 mg by mouth daily.    Marland Kitchen gabapentin (NEURONTIN) 100 MG capsule Take 400 mg by mouth 2 (two) times daily.     Marland Kitchen olmesartan-hydrochlorothiazide (BENICAR HCT) 20-12.5 MG per tablet Take 1 tablet by mouth daily.    Marland Kitchen omeprazole (PRILOSEC) 20 MG capsule Take 20 mg by mouth daily as needed (for indigestion).    . rosuvastatin (CRESTOR) 5 MG tablet Take 5 mg by mouth daily.    . sertraline (ZOLOFT) 100 MG tablet Take 50 mg by mouth daily.      No results found for this or any previous visit (from the past 48 hour(s)). No results found.  Positive for high blood pressure and cholesterol swelling in her feet depression  Blood pressure  142/64, pulse 70, temperature 97.9 F (36.6 C), temperature source Oral, resp. rate 18, height 5\' 5"  (1.651 m), weight 112.4 kg (247 lb 12.8 oz), SpO2 95 %.  The patient is awake alert and oriented. She is no facial asymmetry. Reflexes are decreased but equal. Strength however is 5 over 5 and sensation is intact. Assessment/Plan Impression is that of adjacent level disease at L2-3. The plan is for extension of her fusion to L2-3 with extension of her instrumentation.  Faythe Ghee, MD 07/20/2014, 7:18 AM

## 2014-07-20 NOTE — Anesthesia Preprocedure Evaluation (Addendum)
Anesthesia Evaluation  Patient identified by MRN, date of birth, ID band Patient awake    Reviewed: Allergy & Precautions, NPO status , Patient's Chart, lab work & pertinent test results, reviewed documented beta blocker date and time   History of Anesthesia Complications (+) PONV and history of anesthetic complications  Airway Mallampati: I  TM Distance: >3 FB Neck ROM: Full    Dental  (+) Teeth Intact, Dental Advisory Given   Pulmonary neg pulmonary ROS,  breath sounds clear to auscultation        Cardiovascular hypertension, Pt. on medications Rhythm:Regular Rate:Normal     Neuro/Psych Anxiety Depression Chronic back pain    GI/Hepatic Neg liver ROS, GERD-  Medicated and Controlled,  Endo/Other  Morbid obesity  Renal/GU negative Renal ROS     Musculoskeletal  (+) Arthritis -, Osteoarthritis,    Abdominal (+) + obese,   Peds  Hematology negative hematology ROS (+)   Anesthesia Other Findings   Reproductive/Obstetrics                           Anesthesia Physical Anesthesia Plan  ASA: III  Anesthesia Plan: General   Post-op Pain Management:    Induction: Intravenous  Airway Management Planned: Oral ETT  Additional Equipment:   Intra-op Plan:   Post-operative Plan: Extubation in OR  Informed Consent: I have reviewed the patients History and Physical, chart, labs and discussed the procedure including the risks, benefits and alternatives for the proposed anesthesia with the patient or authorized representative who has indicated his/her understanding and acceptance.   Dental advisory given  Plan Discussed with: CRNA, Surgeon and Anesthesiologist  Anesthesia Plan Comments: (Plan routine monitors, GETA)       Anesthesia Quick Evaluation

## 2014-07-20 NOTE — Anesthesia Procedure Notes (Signed)
Procedure Name: Intubation Date/Time: 07/20/2014 7:43 AM Performed by: Carney Living Pre-anesthesia Checklist: Patient identified, Emergency Drugs available, Suction available, Patient being monitored and Timeout performed Patient Re-evaluated:Patient Re-evaluated prior to inductionOxygen Delivery Method: Circle system utilized Preoxygenation: Pre-oxygenation with 100% oxygen Intubation Type: IV induction Ventilation: Mask ventilation without difficulty and Oral airway inserted - appropriate to patient size Laryngoscope Size: Mac and 4 Grade View: Grade I Tube type: Oral Tube size: 7.5 mm Number of attempts: 1 Airway Equipment and Method: Stylet Placement Confirmation: ETT inserted through vocal cords under direct vision,  positive ETCO2 and breath sounds checked- equal and bilateral Secured at: 22 cm Tube secured with: Tape Dental Injury: Teeth and Oropharynx as per pre-operative assessment

## 2014-07-21 NOTE — Progress Notes (Signed)
Postoperative day 1. Overall doing well. Pain well controlled. Still complains of some numbness in both anterior thighs. Has been out of bed briefly but still feels somewhat unstable in her right lower extremity.  Afebrile. Vital stable. Urine output good. Drain output moderate. Awake and alert. Oriented and appropriate. Motor and sensory function stable.  Overall progressing well. Continue efforts at mobilization. Possible discharge tomorrow.

## 2014-07-21 NOTE — Evaluation (Signed)
Occupational Therapy Evaluation Patient Details Name: LOLETA FROMMELT MRN: [id number] DOB: 29-Oct-1960 Today's Date: 07/21/2014    History of Present Illness Patient is a 54 y/o female with h/o L3-4 fusion who slipped and fell in shower 9 months ago with residual pain and radiculopathy.  Now s/p harware removal L3-4 and Decompression/Laminectomy microdiscectomy and fusion L2-3.   Clinical Impression   Pt s/p above. Education provided to pt and spouse and they verbalized understanding of information covered. OT signing off.    Follow Up Recommendations  No OT follow up;Supervision - Intermittent    Equipment Recommendations  Other (comment) (AE)    Recommendations for Other Services       Precautions / Restrictions Precautions Precautions: Fall;Back Precaution Booklet Issued:  (one in room) Precaution Comments: educated on back precautions Required Braces or Orthoses: Spinal Brace Spinal Brace: Lumbar corset;Applied in sitting position Restrictions Weight Bearing Restrictions: No      Mobility Bed Mobility Overal bed mobility: Needs Assistance Bed Mobility: Sit to Sidelying;Rolling Rolling: Supervision       Sit to sidelying: Supervision General bed mobility comments: cues for technique. Reviewed technique to get out of bed.  Transfers Overall transfer level: Needs assistance Equipment used: Rolling walker (2 wheeled) Transfers: Sit to/from Stand Sit to Stand: Min guard         General transfer comment: Cues for hand placement. discussed with spouse to hold walker just in case as pt pulled up on walker.    Balance Min guard for ambulation with RW.                           ADL Overall ADL's : Needs assistance/impaired     Grooming: Supervision/safety;Standing;Set up (applied sanitizer standing)           Upper Body Dressing : Sitting;Supervision/safety;Set up (doffed back brace)   Lower Body Dressing: Minimal assistance;With adaptive  equipment;Sit to/from stand   Toilet Transfer: Min guard;Ambulation;RW (bed)   Toileting- Clothing Manipulation and Hygiene: Moderate assistance;Sit to/from stand   Tub/ Shower Transfer: Min guard;Ambulation;Rolling walker;Tub transfer   Functional mobility during ADLs: Min guard;Rolling walker General ADL Comments: Educated on AE/cost/where they could purchase and pt practiced with reacher/sockaid. Educated on toilet aide and what pt could use for this. Educated on back brace. Discussed incorporating back precautions into functional activities. Educated on use of cup for oral care and placement of grooming items to avoid breaking precautions. Educated on safety such as safe shoewear, use of bag on walker, and sitting for most of LB ADLs. Reviewed car transfer technique.     Vision     Perception     Praxis      Pertinent Vitals/Pain Pain Assessment: 0-10 Pain Score: 6  Pain Location: back  Pain Descriptors / Indicators: Numbness (reports numbness in legs) Pain Intervention(s): Monitored during session;Repositioned     Hand Dominance Left   Extremity/Trunk Assessment Upper Extremity Assessment Upper Extremity Assessment: Overall WFL for tasks assessed   Lower Extremity Assessment Lower Extremity Assessment: Defer to PT evaluation RLE Deficits / Details: AROM grossly WFL, painful with hip flexion testing (3+/4)  knee extension 4-/5, ankle DF 4-/5 RLE Sensation: decreased light touch (anterior tight and lateral leg) LLE Deficits / Details: AROM grossly WFL, strength hip flexion 4-/5 w/pain, knee extension 4/5, ankle DF 4+/5 LLE Sensation: decreased light touch (anterior tight and lateral leg)       Communication Communication Communication: No difficulties   Cognition  Arousal/Alertness: Awake/alert Behavior During Therapy: WFL for tasks assessed/performed Overall Cognitive Status: Within Functional Limits for tasks assessed                     General Comments        Exercises       Shoulder Instructions      Home Living Family/patient expects to be discharged to:: Private residence Living Arrangements: Spouse/significant other Available Help at Discharge: Family;Available PRN/intermittently Type of Home: House Home Access: Stairs to enter [employer] of Steps: 5 Entrance Stairs-Rails: Left;Right;Can reach both Home Layout: One level     Bathroom Shower/Tub: Teacher, early years/pre: Handicapped height (sink close)     Home Equipment: Environmental consultant - 2 wheels;Shower seat          Prior Functioning/Environment Level of Independence: Independent        Comments: Slow    OT Diagnosis: Acute pain   OT Problem List:     OT Treatment/Interventions:      OT Goals(Current goals can be found in the care plan section)    OT Frequency:     Barriers to D/C:            Co-evaluation              End of Session Equipment Utilized During Treatment: Gait belt;Rolling walker;Back brace Nurse Communication: Mobility status  Activity Tolerance: Patient tolerated treatment well Patient left: in bed;with call bell/phone within reach;with family/visitor present   Time: 1140-1159 OT Time Calculation (min): 19 min Charges:  OT General Charges $OT Visit: 1 Procedure OT Evaluation $Initial OT Evaluation Tier I: 1 Procedure G-CodesBenito Mccreedy OTR/L [id number] 07/21/2014, 12:15 PM

## 2014-07-21 NOTE — Evaluation (Signed)
Physical Therapy Evaluation Patient Details Name: Cheryl Yang MRN: [id number] DOB: [birth year] Today's Date: 07/21/2014   History of Present Illness  Patient is a 54 y/o female with h/o L3-4 fusion who slipped and fell in shower 9 months ago with residual pain and radiculopathy.  Now s/p harware removal L3-4 and Decompression/Laminectomy microdiscectomy and fusion L2-3.  Clinical Impression  Patient presents with stiffness and surgical pain with residual numbness in LE's.  Overall functions with minguard to supervision level of assist.  Spouse present and educated with patient in mobility techniques with back precautions.  Feel no further skilled PT needs at this time.  Encouraged patient and informed nursing to assist with hallway ambulation 2 more times today.  OT needed to instruct in AE and consider shower chair.    Follow Up Recommendations No PT follow up    Equipment Recommendations  None recommended by PT    Recommendations for Other Services OT consult     Precautions / Restrictions Precautions Precautions: Fall;Back Precaution Booklet Issued: Yes (comment) Required Braces or Orthoses: Spinal Brace Spinal Brace: Lumbar corset;Applied in sitting position      Mobility  Bed Mobility               General bed mobility comments: Pt in chair, demonstrated log roll technique  Transfers Overall transfer level: Needs assistance Equipment used: 1 person hand held assist Transfers: Sit to/from Stand Sit to Stand: Min assist         General transfer comment: increased time from chair (walker too far away due to bed too close and pt reported could walk to the walker; to sit brought walker and sat with hands on walker, educated to reach for armrests to sit  Ambulation/Gait Ambulation/Gait assistance: Supervision;Min guard Ambulation Distance (Feet): 250 Feet Assistive device: Rolling walker (2 wheeled) Gait Pattern/deviations: Step-through pattern;Wide base of  support;Shuffle     General Gait Details: waddling gait with decreased trunk rotation  Stairs Stairs: Yes Stairs assistance: Min guard Stair Management: Step to pattern;Two rails;Forwards Number of Stairs: 4 General stair comments: cues for step to with ascent with stronger leg, descent with weaker leg; spouse present and educated how to assist  Wheelchair Mobility    Modified Rankin (Stroke Patients Only)       Balance Overall balance assessment: Needs assistance           Standing balance-Leahy Scale: Fair Standing balance comment: UE support for ambulation due to pain in back                             Pertinent Vitals/Pain Pain Assessment: 0-10 Pain Score: 6  Pain Location: lower back Pain Intervention(s): Monitored during session    [place] expects to be discharged to:: Private residence Living Arrangements: Spouse/significant other Available Help at Discharge: Family;Available PRN/intermittently Type of Home: House Home Access: Stairs to enter Entrance Stairs-Rails: Left;Right;Can reach both Entrance Stairs-Number of Steps: 5 Home Layout: One level Home Equipment: Walker - 2 wheels;Other (comment) (handicapped height toilet)      Prior Function Level of Independence: Independent         Comments: Slow     Hand Dominance   Dominant Hand: Left    Extremity/Trunk Assessment               Lower Extremity Assessment: RLE deficits/detail;LLE deficits/detail RLE Deficits / Details: AROM grossly WFL, painful with hip flexion testing (3+/4)  knee extension  4-/5, ankle DF 4-/5 LLE Deficits / Details: AROM grossly WFL, strength hip flexion 4-/5 w/pain, knee extension 4/5, ankle DF 4+/5     Communication   Communication: No difficulties  Cognition Arousal/Alertness: Awake/alert Behavior During Therapy: WFL for tasks assessed/performed Overall Cognitive Status: Within Functional Limits for tasks assessed                       General Comments General comments (skin integrity, edema, etc.): Able to tighten brace when standing    Exercises        Assessment/Plan    PT Assessment Patent does not need any further PT services  PT Diagnosis Difficulty walking;Acute pain   PT Problem List    PT Treatment Interventions     PT Goals (Current goals can be found in the Care Plan section) Acute Rehab PT Goals PT Goal Formulation: All assessment and education complete, DC therapy    Frequency     Barriers to discharge        Co-evaluation               End of Session Equipment Utilized During Treatment: Back brace Activity Tolerance: Patient tolerated treatment well Patient left: in chair;with call bell/phone within reach Nurse Communication: Mobility status         Time: 1791-5056 PT Time Calculation (min) (ACUTE ONLY): 28 min   Charges:   PT Evaluation $Initial PT Evaluation Tier I: 1 Procedure PT Treatments $Gait Training: 8-22 mins   PT G Codes:        WYNN,CYNDI 08/02/2014, 9:28 AM  Magda Kiel, PT [phone] August 02, 2014

## 2014-07-22 MED ORDER — DIAZEPAM 5 MG PO TABS
5.0000 mg | ORAL_TABLET | Freq: Four times a day (QID) | ORAL | Status: DC | PRN
Start: 2014-07-22 — End: 2014-07-23
  Administered 2014-07-22 – 2014-07-23 (×4): 5 mg via ORAL
  Filled 2014-07-22 (×4): qty 1

## 2014-07-22 NOTE — Progress Notes (Signed)
Patient complaining of more lower back pain today. Symptoms aggravated by mobilization with therapy today. Complaining of a great deal of lumbar spasm. Still having some numbness into her anterior thighs bilaterally. No symptoms of motor weakness. No abdominal pain.  Afebrile. Vitals are stable. Drain output lessening. Motor and sensory examination stable. Dressing clean and dry. Abdomen soft and nontender.  Status post L2-3 decompression and fusion. Patient progressing slowly. We will add volume as a muscle relaxant and continue efforts at slow mobilization. Deciliter not ready for discharge today. We'll see how she's doing tomorrow.

## 2014-07-23 MED ORDER — DIAZEPAM 5 MG PO TABS: 5.0000 mg | ORAL_TABLET | Freq: Four times a day (QID) | ORAL | Status: DC | PRN

## 2014-07-23 MED ORDER — OXYCODONE-ACETAMINOPHEN 5-325 MG PO TABS: 1.0000 | ORAL_TABLET | ORAL | Status: DC | PRN

## 2014-07-23 NOTE — Discharge Summary (Signed)
Physician Discharge Summary  Patient ID: Cheryl Yang MRN: [id number] DOB/AGE: [birth year] 54 y.o.  Admit date: 07/20/2014 Discharge date: 07/23/2014  Admission Diagnoses:  Discharge Diagnoses:  Active Problems:   Lumbar spinal stenosis   Discharged Condition: good  Hospital Course: The patient is admitted to the hospital for treatment of L2-3 spinal stenosis. Patient underwent decompression and fusion without difficulty or complication. Postoperative she is progressed slowly but is making good improvement. She is an plating without difficulty. She has her pain under recently good control. She feels ready for discharge home.  Consults:   Significant Diagnostic Studies:   Treatments:   Discharge Exam: Blood pressure 169/59, pulse 91, temperature 98.6 F (37 C), temperature source Oral, resp. rate 20, height 5\' 5"  (1.651 m), weight 112.4 kg (247 lb 12.8 oz), SpO2 94 %. Awake and alert. Oriented and appropriate. Motor and sensory function stable. Wound clean and dry. Chest and abdomen 9.  Disposition:      Medication List    TAKE these medications        Co Q10 100 MG Caps  Take 200 mg by mouth daily.     diazepam 5 MG tablet  Commonly known as:  VALIUM  Take 1-2 tablets (5-10 mg total) by mouth every 6 (six) hours as needed for anxiety or muscle spasms.     diclofenac 75 MG EC tablet  Commonly known as:  VOLTAREN  Take 75 mg by mouth 2 (two) times daily.     fexofenadine 180 MG tablet  Commonly known as:  ALLEGRA  Take 180 mg by mouth daily.     gabapentin 100 MG capsule  Commonly known as:  NEURONTIN  Take 400 mg by mouth 2 (two) times daily.     olmesartan-hydrochlorothiazide 20-12.5 MG per tablet  Commonly known as:  BENICAR HCT  Take 1 tablet by mouth daily.     omeprazole 20 MG capsule  Commonly known as:  PRILOSEC  Take 20 mg by mouth daily as needed (for indigestion).     oxyCODONE-acetaminophen 5-325 MG per tablet  Commonly known as:   PERCOCET/ROXICET  Take 1-2 tablets by mouth every 4 (four) hours as needed for moderate pain.     rosuvastatin 5 MG tablet  Commonly known as:  CRESTOR  Take 5 mg by mouth daily.     sertraline 100 MG tablet  Commonly known as:  ZOLOFT  Take 50 mg by mouth daily.           Follow-up Information    Follow up with Faythe Ghee, MD.   Specialty:  Neurosurgery   Contact information:   [address] [place] [address] [phone]       Signed: Charlie Pitter 07/23/2014, 12:25 PM

## 2014-07-23 NOTE — Discharge Instructions (Signed)
Wound Care  Keep the incision clean and dry remove the outer dressing in 2 days, leave the Steri-Strips intact.  Do not put any creams, lotions, or ointments on incision. Leave steri-strips on back.  They will fall off by themselves.  Activity Walk each and every day, increasing distance each day. No lifting greater than 5 lbs.  No lifting no bending no twisting. No driving or riding a car unless coming back and forth to see me. Mostly bedrest, get up and move around at 9-10 times a day If provided with back brace, wear when out of bed.  It is not necessary to wear brace in bed. Diet Resume your normal diet.   Return to Work Will be discussed at you follow up appointment.  Call Your Doctor If Any of These Occur Redness, drainage, or swelling at the wound.  Temperature greater than 101 degrees. Severe pain not relieved by pain medication. Incision starts to come apart. Follow Up Appt Call today for appointment in 1-2 weeks ([phone]) or for problems.  If you have any hardware placed in your spine, you will need an x-ray before your appointment.

## 2014-07-23 NOTE — Progress Notes (Signed)
Patient alert and oriented, mae's well, voiding adequate amount of urine, swallowing without difficulty, no c/o pain. Patient discharged home with family. Script and discharged instructions given to patient. Patient and family stated understanding of d/c instructions given and has an appointment with MD. Aisha Mahdi Frye RN 

## 2014-07-31 ENCOUNTER — Emergency Department: Payer: Self-pay | Admitting: Emergency Medicine

## 2014-09-06 LAB — BASIC METABOLIC PANEL: Glucose: 134 mg/dL

## 2014-09-06 LAB — HEMOGLOBIN A1C: Hgb A1c MFr Bld: 6.3 % — AB (ref 4.0–6.0)

## 2014-09-30 ENCOUNTER — Other Ambulatory Visit: Payer: Self-pay | Admitting: Neurosurgery

## 2014-09-30 DIAGNOSIS — M48061 Spinal stenosis, lumbar region without neurogenic claudication: Secondary | ICD-10-CM

## 2014-10-12 ENCOUNTER — Ambulatory Visit
Admission: RE | Admit: 2014-10-12 | Discharge: 2014-10-12 | Disposition: A | Discharge status: Home / Self Care | Payer: BLUE CROSS/BLUE SHIELD | Source: Ambulatory Visit | Attending: Neurosurgery | Admitting: Neurosurgery

## 2014-10-12 ENCOUNTER — Other Ambulatory Visit
Admission: AD | Admit: 2014-10-12 | Discharge: 2014-10-12 | Disposition: A | Discharge status: Home / Self Care | Payer: BLUE CROSS/BLUE SHIELD | Source: Ambulatory Visit | Attending: Neurosurgery | Admitting: Neurosurgery

## 2014-10-12 DIAGNOSIS — M4806 Spinal stenosis, lumbar region: Secondary | ICD-10-CM | POA: Insufficient documentation

## 2014-10-12 DIAGNOSIS — M48061 Spinal stenosis, lumbar region without neurogenic claudication: Secondary | ICD-10-CM

## 2014-10-12 LAB — CREATININE, SERUM
CREATININE: 0.83 mg/dL (ref 0.44–1.00)
GFR calc Af Amer: >60 mL/min (ref >60)
GFR calc non Af Amer: >60 mL/min (ref >60)

## 2014-10-12 LAB — BUN: BUN: 16 mg/dL (ref 6–20)

## 2014-10-12 MED ORDER — GADOBENATE DIMEGLUMINE 529 MG/ML IV SOLN
20.0000 mL | Freq: Once | INTRAVENOUS | Status: AC | PRN
  Administered 2014-10-12: 20 mL via INTRAVENOUS

## 2014-11-06 DIAGNOSIS — E669 Obesity, unspecified: Secondary | ICD-10-CM | POA: Insufficient documentation

## 2014-11-06 DIAGNOSIS — J302 Other seasonal allergic rhinitis: Secondary | ICD-10-CM | POA: Insufficient documentation

## 2014-11-06 DIAGNOSIS — E785 Hyperlipidemia, unspecified: Secondary | ICD-10-CM | POA: Insufficient documentation

## 2014-11-06 DIAGNOSIS — I10 Essential (primary) hypertension: Secondary | ICD-10-CM | POA: Insufficient documentation

## 2014-11-06 DIAGNOSIS — R0683 Snoring: Secondary | ICD-10-CM | POA: Insufficient documentation

## 2014-11-06 DIAGNOSIS — M797 Fibromyalgia: Secondary | ICD-10-CM | POA: Insufficient documentation

## 2014-11-06 DIAGNOSIS — G8929 Other chronic pain: Secondary | ICD-10-CM | POA: Insufficient documentation

## 2014-11-06 DIAGNOSIS — M549 Dorsalgia, unspecified: Secondary | ICD-10-CM

## 2014-11-06 DIAGNOSIS — F330 Major depressive disorder, recurrent, mild: Secondary | ICD-10-CM | POA: Insufficient documentation

## 2014-11-06 DIAGNOSIS — D250 Submucous leiomyoma of uterus: Secondary | ICD-10-CM | POA: Insufficient documentation

## 2014-11-06 DIAGNOSIS — R7303 Prediabetes: Secondary | ICD-10-CM | POA: Insufficient documentation

## 2014-11-29 ENCOUNTER — Other Ambulatory Visit: Payer: Self-pay | Admitting: Family Medicine

## 2014-12-14 LAB — CBC AND DIFFERENTIAL
HEMATOCRIT: 42 % (ref 36–46)
HEMOGLOBIN: 14.7 g/dL (ref 12.0–16.0)
NEUTROS ABS: 5 /uL
Platelets: 228 10*3/uL (ref 150–399)
WBC: 8.4 10*3/mL

## 2014-12-14 LAB — BASIC METABOLIC PANEL
BUN: 14 mg/dL (ref 4–21)
CREATININE: 0.7 mg/dL (ref 0.5–1.1)
Glucose: 127 mg/dL
Potassium: 3.8 mmol/L (ref 3.4–5.3)
Sodium: 141 mmol/L (ref 137–147)

## 2014-12-14 LAB — LIPID PANEL
CHOLESTEROL: 164 mg/dL (ref 0–200)
HDL: 42 mg/dL (ref 35–70)
LDL CALC: 95 mg/dL
TRIGLYCERIDES: 136 mg/dL (ref 40–160)

## 2014-12-14 LAB — HEPATIC FUNCTION PANEL
ALK PHOS: 104 U/L (ref 25–125)
ALT: 38 U/L — AB (ref 7–35)
AST: 26 U/L (ref 13–35)
Bilirubin, Total: 0.8 mg/dL

## 2014-12-14 LAB — TSH: TSH: 1.54 u[IU]/mL (ref 0.41–5.90)

## 2014-12-23 ENCOUNTER — Encounter: Payer: Self-pay | Admitting: Family Medicine

## 2014-12-23 ENCOUNTER — Ambulatory Visit (INDEPENDENT_AMBULATORY_CARE_PROVIDER_SITE_OTHER): Payer: BLUE CROSS/BLUE SHIELD | Admitting: Family Medicine

## 2014-12-23 ENCOUNTER — Other Ambulatory Visit: Payer: Self-pay | Admitting: Family Medicine

## 2014-12-23 VITALS — BP 126/82 | HR 96 | Temp 97.9°F | Resp 14 | Wt 231.0 lb

## 2014-12-23 DIAGNOSIS — J302 Other seasonal allergic rhinitis: Secondary | ICD-10-CM | POA: Diagnosis not present

## 2014-12-23 DIAGNOSIS — M48061 Spinal stenosis, lumbar region without neurogenic claudication: Secondary | ICD-10-CM

## 2014-12-23 DIAGNOSIS — N3001 Acute cystitis with hematuria: Secondary | ICD-10-CM | POA: Diagnosis not present

## 2014-12-23 DIAGNOSIS — M549 Dorsalgia, unspecified: Secondary | ICD-10-CM

## 2014-12-23 DIAGNOSIS — I10 Essential (primary) hypertension: Secondary | ICD-10-CM | POA: Diagnosis not present

## 2014-12-23 DIAGNOSIS — M4806 Spinal stenosis, lumbar region: Secondary | ICD-10-CM

## 2014-12-23 DIAGNOSIS — G8929 Other chronic pain: Secondary | ICD-10-CM | POA: Diagnosis not present

## 2014-12-23 MED ORDER — NITROFURANTOIN MACROCRYSTAL 100 MG PO CAPS: 100.0000 mg | ORAL_CAPSULE | Freq: Two times a day (BID) | ORAL | Status: DC

## 2014-12-23 NOTE — Progress Notes (Signed)
Patient ID: Cheryl Yang, female   DOB: [birth year], 54 y.o.   MRN: [id number]    Subjective:  HPI   Patient is here for an acute issue. 3 days ago around August 8th started to have burning with urination, spasms sensation at the end of urination,, blood clots yesterday, pink with wiping after urination and she has taking cranberry pills. Denies fever, nausea, chills. She is sexually active, no protection is used she had hysterectomy and husband had vasectomy.    Prior to Admission medications   Medication Sig Start Date End Date Taking? Authorizing Provider  Coenzyme Q10 (CO Q10) 100 MG CAPS Take 200 mg by mouth daily.    Historical Provider, MD  diclofenac (VOLTAREN) 75 MG EC tablet Take 75 mg by mouth 2 (two) times daily.    Historical Provider, MD  fexofenadine (ALLEGRA) 180 MG tablet Take 180 mg by mouth daily.    Historical Provider, MD  gabapentin (NEURONTIN) 100 MG capsule Take 400 mg by mouth 2 (two) times daily.     Historical Provider, MD  olmesartan-hydrochlorothiazide (BENICAR HCT) 20-12.5 MG per tablet Take 1 tablet by mouth daily.    Historical Provider, MD  omeprazole (PRILOSEC) 20 MG capsule Take 20 mg by mouth daily as needed (for indigestion).    Historical Provider, MD  oxyCODONE-acetaminophen (PERCOCET/ROXICET) 5-325 MG per tablet Take 1-2 tablets by mouth every 4 (four) hours as needed for moderate pain. 07/23/14   Earnie Larsson, MD  rosuvastatin (CRESTOR) 5 MG tablet Take 5 mg by mouth daily.    Historical Provider, MD  sertraline (ZOLOFT) 100 MG tablet take 0.5 tablet by mouth once daily 11/29/14   Jerrol Banana., MD  triamcinolone (NASACORT ALLERGY 24HR) 55 MCG/ACT AERO nasal inhaler Place into the nose daily.    Historical Provider, MD    Patient Active Problem List   Diagnosis Date Noted  . Back pain, chronic 11/06/2014  . Essential (primary) hypertension 11/06/2014  . Fibrositis 11/06/2014  . Borderline diabetes 11/06/2014  . Depression, major,  recurrent, mild 11/06/2014  . Adiposity 11/06/2014  . HLD (hyperlipidemia) 11/06/2014  . Allergic rhinitis, seasonal 11/06/2014  . Snores 11/06/2014  . Submucous leiomyoma of uterus 11/06/2014  . Lumbar spinal stenosis 07/20/2014    Past Medical History  Diagnosis Date  . Hypertension   . Anxiety   . Depression   . GERD (gastroesophageal reflux disease)     occ, uses Prilosec as needed with anti-imflammatories  . Arthritis   . Anemia     due to fibroids (has had hysterectomy)  . Environmental and seasonal allergies   . Eczema   . PONV (postoperative nausea and vomiting)     Social History   Social History  . Marital Status: Married    Spouse Name: N/A  . Number of Children: N/A  . Years of Education: N/A   Occupational History  . Not on file.   Social History Main Topics  . Smoking status: Never Smoker   . Smokeless tobacco: Never Used  . Alcohol Use: 0.0 oz/week    0 Standard drinks or equivalent per week     Comment: very rare  . Drug Use: No  . Sexual Activity: Yes   Other Topics Concern  . Not on file   Social History Narrative    Allergies  Allergen Reactions  . Salmon [Fish Allergy] Itching  . Seldane [Terfenadine] Rash  . Tagamet [Cimetidine] Itching and Rash    Review of Systems  Constitutional:  Negative for fever, chills and malaise/fatigue.  Respiratory: Negative for cough, hemoptysis, sputum production and shortness of breath.   Cardiovascular: Negative for chest pain, palpitations, orthopnea, claudication and leg swelling.  Gastrointestinal: Negative for heartburn, nausea and vomiting.  Genitourinary: Positive for dysuria and hematuria.  Musculoskeletal: Positive for back pain and joint pain. Negative for myalgias and neck pain.  Skin: Negative for itching and rash.  Neurological: Negative for weakness and headaches.    Immunization History  Administered Date(s) Administered  . Td 04/29/2003  . Tdap 11/23/2013   Objective:  BP  126/82 mmHg  Pulse 96  Temp(Src) 97.9 F (36.6 C)  Resp 14  Wt 231 lb (104.781 kg)  Physical Exam  Constitutional: She is oriented to person, place, and time and well-developed, well-nourished, and in no distress.  Cardiovascular: Normal rate, regular rhythm, normal heart sounds and intact distal pulses.   No murmur heard. Pulmonary/Chest: Effort normal and breath sounds normal. No respiratory distress.  Abdominal: Soft. Bowel sounds are normal. She exhibits no distension. There is no tenderness. There is no guarding.  No CVAT  Musculoskeletal: She exhibits no edema or tenderness.  Neurological: She is alert and oriented to person, place, and time.  Psychiatric: Affect and judgment normal.    Lab Results  Component Value Date   WBC 8.5 07/16/2014   HGB 13.7 07/16/2014   HCT 40.4 07/16/2014   PLT 233 07/16/2014   GLUCOSE 127* 07/16/2014   HGBA1C 6.3* 09/06/2014    CMP     Component Value Date/Time   NA 138 07/16/2014 1437   K 3.1* 07/16/2014 1437   CL 100 07/16/2014 1437   CO2 28 07/16/2014 1437   GLUCOSE 127* 07/16/2014 1437   BUN 16 10/12/2014 1335   CREATININE 0.83 10/12/2014 1335   CREATININE 0.97 05/28/2014 1325   CALCIUM 9.1 07/16/2014 1437   GFRNONAA >60 10/12/2014 1335   GFRAA >60 10/12/2014 1335    Assessment and Plan :  1. Acute cystitis with hematuria New. Will start treatment, pending urine culture. - Urine Culture - POCT Urinalysis Dipstick - nitrofurantoin (MACRODANTIN) 100 MG capsule; Take 1 capsule (100 mg total) by mouth 2 (two) times daily.  Dispense: 10 capsule; Refill: 1 Discussed regular postcoital voiding with patient. 2. Back pain, chronic Chronic-last surgery in March 2016. Getting spine injections now.  3. Lumbar spinal stenosis  4. Allergic rhinitis, seasonal Stable. 5. Essential (primary) hypertension Stable.    Patient was seen and examined by Dr. Eulas Post and note was scribed by Theressa Millard,  RMA.     Miguel Aschoff MD [facility] 12/23/2014 2:46 PM

## 2014-12-25 LAB — URINE CULTURE

## 2014-12-29 ENCOUNTER — Telehealth: Payer: Self-pay

## 2014-12-29 ENCOUNTER — Telehealth: Payer: Self-pay | Admitting: Family Medicine

## 2014-12-29 NOTE — Telephone Encounter (Signed)
LMTCB ED 

## 2014-12-29 NOTE — Telephone Encounter (Signed)
Advised  ED 

## 2014-12-29 NOTE — Telephone Encounter (Signed)
-----   Message from Jerrol Banana., MD sent at 12/29/2014  9:33 AM EDT ----- Sensitive to nitrofurantoin which is what I think we use.

## 2014-12-29 NOTE — Telephone Encounter (Signed)
Pt is returning call to [place].  CB#[phone]/MW

## 2015-01-12 ENCOUNTER — Encounter: Payer: Self-pay | Admitting: Family Medicine

## 2015-01-12 ENCOUNTER — Ambulatory Visit (INDEPENDENT_AMBULATORY_CARE_PROVIDER_SITE_OTHER): Payer: BLUE CROSS/BLUE SHIELD | Admitting: Family Medicine

## 2015-01-12 VITALS — BP 138/78 | HR 78 | Temp 98.1°F | Resp 16 | Wt 227.0 lb

## 2015-01-12 DIAGNOSIS — R7309 Other abnormal glucose: Secondary | ICD-10-CM

## 2015-01-12 DIAGNOSIS — R7303 Prediabetes: Secondary | ICD-10-CM

## 2015-01-12 DIAGNOSIS — F330 Major depressive disorder, recurrent, mild: Secondary | ICD-10-CM

## 2015-01-12 DIAGNOSIS — I10 Essential (primary) hypertension: Secondary | ICD-10-CM

## 2015-01-12 DIAGNOSIS — G8929 Other chronic pain: Secondary | ICD-10-CM

## 2015-01-12 DIAGNOSIS — M549 Dorsalgia, unspecified: Secondary | ICD-10-CM | POA: Diagnosis not present

## 2015-01-12 LAB — POCT GLYCOSYLATED HEMOGLOBIN (HGB A1C): HEMOGLOBIN A1C: 6.3

## 2015-01-12 MED ORDER — GABAPENTIN 300 MG PO CAPS: 300.0000 mg | ORAL_CAPSULE | Freq: Two times a day (BID) | ORAL | Status: DC

## 2015-01-12 NOTE — Progress Notes (Signed)
Patient ID: Cheryl Yang, female   DOB: August 08, 1960, 54 y.o.   MRN: [id number]    Subjective:  HPI  Pre- Diabetes, Follow-up:   Lab Results  Component Value Date   HGBA1C 6.3* 09/06/2014    Last seen for diabetes 4 months ago.  Management changes included none. She reports good compliance with treatment. She is not having side effects.  Current symptoms include none Home blood sugar records: 90's- 115 fasting  Episodes of hypoglycemia? no   Current Insulin Regimen: n/a Most Recent Eye Exam: more than a year Current exercise: bicycling daily or walking.  Pertinent Labs:    Component Value Date/Time   CREATININE 0.83 10/12/2014 1335   CREATININE 0.97 05/28/2014 1325    Wt Readings from Last 3 Encounters:  01/12/15 227 lb (102.967 kg)  12/23/14 231 lb (104.781 kg)  09/06/14 238 lb (107.956 kg)    ------------------------------------------------------------------------    Hypertension, follow-up:  BP Readings from Last 3 Encounters:  01/12/15 138/78  12/23/14 126/82  09/06/14 122/88    She was last seen for hypertension 4 months ago.  BP at that visit was 122/88. Management changes since that visit include none. She reports good compliance with treatment. She is not having side effects.  She is exercising. Outside blood pressures are 110's/70's. She is experiencing none.  Patient denies none.    Weight trend: decreasing steadily Wt Readings from Last 3 Encounters:  01/12/15 227 lb (102.967 kg)  12/23/14 231 lb (104.781 kg)  09/06/14 238 lb (107.956 kg)     ------------------------------------------------------------------------     Prior to Admission medications   Medication Sig Start Date End Date Taking? Authorizing Provider  Coenzyme Q10 (CO Q10) 100 MG CAPS Take 200 mg by mouth daily.   Yes Historical Provider, MD  diclofenac (VOLTAREN) 75 MG EC tablet Take 75 mg by mouth 2 (two) times daily.   Yes Historical Provider, MD  fexofenadine  (ALLEGRA) 180 MG tablet Take 180 mg by mouth daily.   Yes Historical Provider, MD  gabapentin (NEURONTIN) 100 MG capsule Take 400 mg by mouth 2 (two) times daily.    Yes Historical Provider, MD  olmesartan-hydrochlorothiazide (BENICAR HCT) 20-12.5 MG per tablet Take 1 tablet by mouth daily.   Yes Historical Provider, MD  omeprazole (PRILOSEC) 20 MG capsule Take 20 mg by mouth daily as needed (for indigestion).   Yes Historical Provider, MD  oxyCODONE-acetaminophen (PERCOCET/ROXICET) 5-325 MG per tablet Take 1-2 tablets by mouth every 4 (four) hours as needed for moderate pain. 07/23/14  Yes Earnie Larsson, MD  rosuvastatin (CRESTOR) 5 MG tablet Take 5 mg by mouth daily.   Yes Historical Provider, MD  sertraline (ZOLOFT) 100 MG tablet take 0.5 tablet by mouth once daily 11/29/14  Yes Richard Maceo Pro., MD  triamcinolone (NASACORT ALLERGY 24HR) 55 MCG/ACT AERO nasal inhaler Place into the nose daily.   Yes Historical Provider, MD    Patient Active Problem List   Diagnosis Date Noted  . Back pain, chronic 11/06/2014  . Essential (primary) hypertension 11/06/2014  . Fibrositis 11/06/2014  . Borderline diabetes 11/06/2014  . Depression, major, recurrent, mild 11/06/2014  . Adiposity 11/06/2014  . HLD (hyperlipidemia) 11/06/2014  . Allergic rhinitis, seasonal 11/06/2014  . Snores 11/06/2014  . Submucous leiomyoma of uterus 11/06/2014  . Lumbar spinal stenosis 07/20/2014    Past Medical History  Diagnosis Date  . Hypertension   . Anxiety   . Depression   . GERD (gastroesophageal reflux disease)  occ, uses Prilosec as needed with anti-imflammatories  . Arthritis   . Anemia     due to fibroids (has had hysterectomy)  . Environmental and seasonal allergies   . Eczema   . PONV (postoperative nausea and vomiting)     Social History   Social History  . Marital Status: Married    Spouse Name: N/A  . Number of Children: N/A  . Years of Education: N/A   Occupational History  . Not  on file.   Social History Main Topics  . Smoking status: Never Smoker   . Smokeless tobacco: Never Used  . Alcohol Use: 0.0 oz/week    0 Standard drinks or equivalent per week     Comment: very rare  . Drug Use: No  . Sexual Activity: Yes   Other Topics Concern  . Not on file   Social History Narrative    Allergies  Allergen Reactions  . Salmon [Fish Allergy] Itching  . Seldane [Terfenadine] Rash  . Tagamet [Cimetidine] Itching and Rash    Review of Systems  Constitutional: Negative.   HENT: Negative.   Eyes: Negative.   Respiratory: Negative.   Cardiovascular: Negative.   Gastrointestinal: Negative.   Genitourinary: Negative.   Musculoskeletal: Positive for back pain.  Skin: Negative.   Neurological: Negative.   Endo/Heme/Allergies: Negative.   Psychiatric/Behavioral: Negative.     Immunization History  Administered Date(s) Administered  . Td 04/29/2003  . Tdap 11/23/2013   Objective:  BP 138/78 mmHg  Pulse 78  Temp(Src) 98.1 F (36.7 C) (Oral)  Resp 16  Wt 227 lb (102.967 kg)  Physical Exam  Lab Results  Component Value Date   WBC 8.5 07/16/2014   HGB 13.7 07/16/2014   HCT 40.4 07/16/2014   PLT 233 07/16/2014   GLUCOSE 127* 07/16/2014   HGBA1C 6.3* 09/06/2014    CMP     Component Value Date/Time   NA 138 07/16/2014 1437   K 3.1* 07/16/2014 1437   CL 100 07/16/2014 1437   CO2 28 07/16/2014 1437   GLUCOSE 127* 07/16/2014 1437   BUN 16 10/12/2014 1335   CREATININE 0.83 10/12/2014 1335   CREATININE 0.97 05/28/2014 1325   CALCIUM 9.1 07/16/2014 1437   GFRNONAA >60 10/12/2014 1335   GFRNONAA >60 05/28/2014 1325   GFRAA >60 10/12/2014 1335   GFRAA >60 05/28/2014 1325    Assessment and Plan :  1. Essential (primary) hypertension    2. Borderline diabetes  - POCT HgB A1C-6.0 today--was 6.3  3. Depression, major, recurrent, mild   4. Back pain, chronic Increase Neurontin from 300 at bedtime to 300--2  at bedtime and 2 in the  day - gabapentin (NEURONTIN) 300 MG capsule; Take 1 capsule (300 mg total) by mouth 2 (two) times daily.  Dispense: 60 capsule; Refill: 12  I have done the exam and reviewed the above chart and it is accurate to the best of my knowledge.  Miguel Aschoff MD [facility] 01/12/2015 8:16 AM

## 2015-03-22 ENCOUNTER — Other Ambulatory Visit: Payer: Self-pay | Admitting: Family Medicine

## 2015-04-10 ENCOUNTER — Other Ambulatory Visit: Payer: Self-pay | Admitting: Family Medicine

## 2015-05-11 ENCOUNTER — Other Ambulatory Visit: Payer: Self-pay | Admitting: Family Medicine

## 2015-05-12 ENCOUNTER — Ambulatory Visit: Payer: BLUE CROSS/BLUE SHIELD | Admitting: Family Medicine

## 2015-05-13 ENCOUNTER — Other Ambulatory Visit: Payer: Self-pay | Admitting: Family Medicine

## 2015-05-13 MED ORDER — ROSUVASTATIN CALCIUM 5 MG PO TABS: 5.0000 mg | ORAL_TABLET | Freq: Every day | ORAL | Status: DC

## 2015-05-13 NOTE — Telephone Encounter (Signed)
Last OV: 01/12/2015  Last refill: 04/11/2015  Patient have a OV scheduled for 06/07/2015

## 2015-05-13 NOTE — Telephone Encounter (Signed)
Pt contacted office for refill request on the following medications:  rosuvastatin (CRESTOR) 5 MG tablet.  [place].  CB#[id number]/MW  This is a Dr Rosanna Randy pt.  Pt is requesting this refill today if possible/MW

## 2015-06-07 ENCOUNTER — Ambulatory Visit (INDEPENDENT_AMBULATORY_CARE_PROVIDER_SITE_OTHER): Payer: BLUE CROSS/BLUE SHIELD | Admitting: Family Medicine

## 2015-06-07 VITALS — BP 122/76 | HR 88 | Temp 98.4°F | Resp 16 | Wt 239.0 lb

## 2015-06-07 DIAGNOSIS — I10 Essential (primary) hypertension: Secondary | ICD-10-CM

## 2015-06-07 DIAGNOSIS — G8929 Other chronic pain: Secondary | ICD-10-CM

## 2015-06-07 DIAGNOSIS — M4806 Spinal stenosis, lumbar region: Secondary | ICD-10-CM | POA: Diagnosis not present

## 2015-06-07 DIAGNOSIS — M549 Dorsalgia, unspecified: Secondary | ICD-10-CM

## 2015-06-07 DIAGNOSIS — K219 Gastro-esophageal reflux disease without esophagitis: Secondary | ICD-10-CM

## 2015-06-07 DIAGNOSIS — R7303 Prediabetes: Secondary | ICD-10-CM

## 2015-06-07 DIAGNOSIS — M48061 Spinal stenosis, lumbar region without neurogenic claudication: Secondary | ICD-10-CM

## 2015-06-07 MED ORDER — GABAPENTIN 300 MG PO CAPS: ORAL_CAPSULE | ORAL | Status: DC

## 2015-06-07 MED ORDER — OMEPRAZOLE 20 MG PO CPDR: 20.0000 mg | DELAYED_RELEASE_CAPSULE | Freq: Every day | ORAL | Status: DC | PRN

## 2015-06-07 NOTE — Progress Notes (Signed)
Patient ID: Cheryl Yang, female   DOB: 02-12-1961, 55 y.o.   MRN: [id number]    Subjective:  HPI  Patient is here for 4 months follow up.  Hypertension: Patient checks her B/P and readings have been around 110/70. NO cardiac symptoms. BP Readings from Last 3 Encounters:  06/07/15 122/76  01/12/15 138/78  12/23/14 126/82   Hyperglycemia: Patient does not check her sugar. Her last A1C was 6.3 in August.   Chronic back pain: She takes Oxycodone 1 tablet at bedtime and Gabapentin usually takes 1 tablet in the evening and at times takes another tablet during the day. She has been trying to take Ibuprofen when she is at work and then Gabapentin on the weekend. This past weekend pain was worse. She has more numbness in her legs, left upper leg feels sore in the muscle like she fell down feeling and has a bruise-but she did not. Everything is stable. Prior to Admission medications   Medication Sig Start Date End Date Taking? Authorizing Provider  BENICAR HCT 20-12.5 MG tablet 1 (one) Tablet, Oral, daily, by mouth 03/23/15  Yes Richard Maceo Pro., MD  Coenzyme Q10 (CO Q10) 100 MG CAPS Take 200 mg by mouth daily.   Yes Historical Provider, MD  diclofenac (VOLTAREN) 75 MG EC tablet Take 75 mg by mouth 2 (two) times daily.   Yes Historical Provider, MD  fexofenadine (ALLEGRA) 180 MG tablet Take 180 mg by mouth daily.   Yes Historical Provider, MD  gabapentin (NEURONTIN) 300 MG capsule Take 1 capsule (300 mg total) by mouth 2 (two) times daily. 01/12/15  Yes Richard Maceo Pro., MD  omeprazole (PRILOSEC) 20 MG capsule Take 20 mg by mouth daily as needed (for indigestion).   Yes Historical Provider, MD  oxyCODONE-acetaminophen (PERCOCET/ROXICET) 5-325 MG per tablet Take 1-2 tablets by mouth every 4 (four) hours as needed for moderate pain. 07/23/14  Yes Earnie Larsson, MD  rosuvastatin (CRESTOR) 5 MG tablet Take 1 tablet (5 mg total) by mouth at bedtime. 05/13/15  Yes Margarita Rana, MD  sertraline  (ZOLOFT) 100 MG tablet take 0.5 tablet by mouth once daily 11/29/14  Yes Richard Maceo Pro., MD  triamcinolone (NASACORT ALLERGY 24HR) 55 MCG/ACT AERO nasal inhaler Place into the nose daily.   Yes Historical Provider, MD    Patient Active Problem List   Diagnosis Date Noted  . Back pain, chronic 11/06/2014  . Essential (primary) hypertension 11/06/2014  . Fibrositis 11/06/2014  . Borderline diabetes 11/06/2014  . Depression, major, recurrent, mild ([place]) 11/06/2014  . Adiposity 11/06/2014  . HLD (hyperlipidemia) 11/06/2014  . Allergic rhinitis, seasonal 11/06/2014  . Snores 11/06/2014  . Submucous leiomyoma of uterus 11/06/2014  . Lumbar spinal stenosis 07/20/2014    Past Medical History  Diagnosis Date  . Hypertension   . Anxiety   . Depression   . GERD (gastroesophageal reflux disease)     occ, uses Prilosec as needed with anti-imflammatories  . Arthritis   . Anemia     due to fibroids (has had hysterectomy)  . Environmental and seasonal allergies   . Eczema   . PONV (postoperative nausea and vomiting)     Social History   Social History  . Marital Status: Married    Spouse Name: N/A  . Number of Children: N/A  . Years of Education: N/A   Occupational History  . Not on file.   Social History Main Topics  . Smoking status: Never Smoker   .  Smokeless tobacco: Never Used  . Alcohol Use: 0.0 oz/week    0 Standard drinks or equivalent per week     Comment: very rare  . Drug Use: No  . Sexual Activity: Yes   Other Topics Concern  . Not on file   Social History Narrative    Allergies  Allergen Reactions  . Salmon [Fish Allergy] Itching  . Seldane [Terfenadine] Rash  . Tagamet [Cimetidine] Itching and Rash    Review of Systems  Constitutional: Negative.   Respiratory: Negative.   Cardiovascular: Negative.   Musculoskeletal: Positive for myalgias and back pain.  Neurological: Positive for tingling (numbness in he legs). Negative for dizziness.     Immunization History  Administered Date(s) Administered  . Influenza-Unspecified 03/20/2015  . Td 04/29/2003  . Tdap 11/23/2013   Objective:  BP 122/76 mmHg  Pulse 88  Temp(Src) 98.4 F (36.9 C)  Resp 16  Wt 239 lb (108.41 kg)  Physical Exam  Constitutional: She is oriented to person, place, and time and well-developed, well-nourished, and in no distress.  HENT:  Head: Normocephalic and atraumatic.  Eyes: Conjunctivae are normal. Pupils are equal, round, and reactive to light.  Neck: Normal range of motion. Neck supple.  Cardiovascular: Normal rate, regular rhythm, normal heart sounds and intact distal pulses.   No murmur heard. Pulmonary/Chest: Effort normal and breath sounds normal. No respiratory distress. She has no wheezes.  Musculoskeletal: She exhibits no edema.  Neurological: She is alert and oriented to person, place, and time.  Psychiatric: Mood, memory, affect and judgment normal.    Lab Results  Component Value Date   WBC 8.5 07/16/2014   HGB 13.7 07/16/2014   HCT 40.4 07/16/2014   PLT 233 07/16/2014   GLUCOSE 127* 07/16/2014   HGBA1C 6.3 01/12/2015    CMP     Component Value Date/Time   NA 138 07/16/2014 1437   K 3.1* 07/16/2014 1437   CL 100 07/16/2014 1437   CO2 28 07/16/2014 1437   GLUCOSE 127* 07/16/2014 1437   BUN 16 10/12/2014 1335   CREATININE 0.83 10/12/2014 1335   CREATININE 0.97 05/28/2014 1325   CALCIUM 9.1 07/16/2014 1437   GFRNONAA >60 10/12/2014 1335   GFRNONAA >60 05/28/2014 1325   GFRAA >60 10/12/2014 1335   GFRAA >60 05/28/2014 1325    Assessment and Plan :  1. Essential (primary) hypertension Stable.  2. Borderline diabetes A1C 5.8 better. Follow. - POCT HgB A1C  3. Back pain, chronic Not better, worsening. Discussed with patient that it is ok to increase Gabapentin to 1 tablet in the morning and 2 tablets at bedtime, and if she tolerates that well she can increase the morning dose to 2 tablets after a couple of  weeks.  Advised patient this is not narcotic and it is ok to increase this as long as she does not feel drowsy.  4. Lumbar spinal stenosis  5. Gastroesophageal reflux disease, esophagitis presence not specified Stable. Refill for Omeprazole given.   Miguel Aschoff MD [facility] 06/07/2015 8:14 AM

## 2015-07-22 ENCOUNTER — Other Ambulatory Visit: Payer: Self-pay | Admitting: Family Medicine

## 2015-07-22 DIAGNOSIS — E785 Hyperlipidemia, unspecified: Secondary | ICD-10-CM

## 2015-08-31 ENCOUNTER — Encounter: Payer: Self-pay | Admitting: Family Medicine

## 2015-09-28 ENCOUNTER — Encounter: Payer: Self-pay | Admitting: Physician Assistant

## 2015-09-28 ENCOUNTER — Ambulatory Visit (INDEPENDENT_AMBULATORY_CARE_PROVIDER_SITE_OTHER): Payer: BLUE CROSS/BLUE SHIELD | Admitting: Physician Assistant

## 2015-09-28 VITALS — BP 118/80 | HR 71 | Temp 97.9°F | Resp 16 | Wt 249.0 lb

## 2015-09-28 DIAGNOSIS — R599 Enlarged lymph nodes, unspecified: Secondary | ICD-10-CM | POA: Diagnosis not present

## 2015-09-28 DIAGNOSIS — T148 Other injury of unspecified body region: Secondary | ICD-10-CM

## 2015-09-28 DIAGNOSIS — R590 Localized enlarged lymph nodes: Secondary | ICD-10-CM

## 2015-09-28 DIAGNOSIS — W57XXXA Bitten or stung by nonvenomous insect and other nonvenomous arthropods, initial encounter: Secondary | ICD-10-CM | POA: Diagnosis not present

## 2015-09-28 DIAGNOSIS — L409 Psoriasis, unspecified: Secondary | ICD-10-CM | POA: Diagnosis not present

## 2015-09-28 MED ORDER — CEPHALEXIN 500 MG PO CAPS: 500.0000 mg | ORAL_CAPSULE | Freq: Two times a day (BID) | ORAL | Status: DC

## 2015-09-28 NOTE — Progress Notes (Signed)
Patient: Cheryl Yang Female    DOB: 1960/11/21   55 y.o.   MRN: [id number] Visit Date: 09/28/2015  Today's Provider: Mar Daring, PA-C   Chief Complaint  Patient presents with  . Adenopathy    Lymph node in neck is swollen   Subjective:    HPI Cheryl Yang is here today with c/o lymph node in right side neck is swollen. She reports that she has a bump on the left side of her head. She also reports that she notice a rash yasterday on her neck and is itching.She does have known eczema of the base of the neck and hairline that is flared currently as well.     Allergies  Allergen Reactions  . Salmon [Fish Allergy] Itching  . Seldane [Terfenadine] Rash  . Tagamet [Cimetidine] Itching and Rash   Previous Medications   BENICAR HCT 20-12.5 MG TABLET    1 (one) Tablet, Oral, daily, by mouth   COENZYME Q10 (CO Q10) 100 MG CAPS    Take 200 mg by mouth daily.   DICLOFENAC (VOLTAREN) 75 MG EC TABLET    Take 75 mg by mouth 2 (two) times daily. Reported on 09/28/2015   FEXOFENADINE (ALLEGRA) 180 MG TABLET    Take 180 mg by mouth daily.   GABAPENTIN (NEURONTIN) 300 MG CAPSULE    2 tablets twice daily   OMEPRAZOLE (PRILOSEC) 20 MG CAPSULE    Take 1 capsule (20 mg total) by mouth daily as needed (for indigestion).   OXYCODONE HCL 10 MG TABS    take 1 tablet by mouth every 4 to 6 hours   ROSUVASTATIN (CRESTOR) 5 MG TABLET    take 1 tablet by mouth at bedtime   SERTRALINE (ZOLOFT) 100 MG TABLET    take 0.5 tablet by mouth once daily   TRIAMCINOLONE (NASACORT ALLERGY 24HR) 55 MCG/ACT AERO NASAL INHALER    Place into the nose daily.    Review of Systems  Constitutional: Negative for fever, chills and fatigue.  HENT: Negative.   Respiratory: Negative for chest tightness, shortness of breath and wheezing.   Cardiovascular: Negative.   Gastrointestinal: Negative.   Musculoskeletal: Negative.   Skin: Positive for rash.  Neurological: Negative.     Social History  Substance Use  Topics  . Smoking status: Never Smoker   . Smokeless tobacco: Never Used  . Alcohol Use: 0.0 oz/week    0 Standard drinks or equivalent per week     Comment: very rare   Objective:   BP 118/80 mmHg  Pulse 71  Temp(Src) 97.9 F (36.6 C) (Oral)  Resp 16  Wt 249 lb (112.946 kg)  Physical Exam  Constitutional: She appears well-developed and well-nourished. No distress.  HENT:  Head: Normocephalic and atraumatic.  Neck: Normal range of motion. Neck supple. No JVD present. No tracheal deviation present. No thyromegaly present.  Cardiovascular: Normal rate, regular rhythm and normal heart sounds.  Exam reveals no gallop and no friction rub.   No murmur heard. Pulmonary/Chest: Effort normal and breath sounds normal. No respiratory distress. She has no wheezes. She has no rales.  Lymphadenopathy:    She has no cervical adenopathy.  Skin: She is not diaphoretic.     Vitals reviewed.       Assessment & Plan:     1. Bug bites Bug bites and psoriasis flare on the posterior neck with lymphadenopathy. I will give Keflex as below to see if this decreases the  lymphadenopathy. She does have a follow-up with her dermatologist already for her what she says is eczema but what I believe to be psoriasis of the posterior scalp. She is to call if symptoms fail to improve or worsen. - cephALEXin (KEFLEX) 500 MG capsule; Take 1 capsule (500 mg total) by mouth 2 (two) times daily.  Dispense: 14 capsule; Refill: 0  2. Lymphadenopathy, posterior cervical See above medical treatment plan.  3. Psoriasis See above medical treatment plan.       Mar Daring, PA-C  [facility]

## 2015-09-28 NOTE — Patient Instructions (Signed)
Insect Bite Mosquitoes, flies, fleas, bedbugs, and many other insects can bite. Insect bites are different from insect stings. A sting is when poison (venom) is injected into the skin. Insect bites can cause pain or itching for a few days, but they are usually not serious. Some insects can spread diseases to people through a bite. SYMPTOMS  Symptoms of an insect bite include:  Itching or pain in the bite area.  Redness and swelling in the bite area.  An open wound (skin ulcer). In many cases, symptoms last for 2-4 days.  DIAGNOSIS  This condition is usually diagnosed based on symptoms and a physical exam. TREATMENT  Treatment is usually not needed for an insect bite. Symptoms often go away on their own. Your health care provider may recommend creams or lotions to help reduce itching. Antibiotic medicines may be prescribed if the bite becomes infected. A tetanus shot may be given in some cases. If you develop an allergic reaction to an insect bite, your health care provider will prescribe medicines to treat the reaction (antihistamines). This is rare. HOME CARE INSTRUCTIONS  Do not scratch the bite area.  Keep the bite area clean and dry. Wash the bite area daily with soap and water as told by your health care provider.  If directed, applyice to the bite area.  Put ice in a plastic bag.  Place a towel between your skin and the bag.  Leave the ice on for 20 minutes, 2-3 times per day.  To help reduce itching and swelling, try applying a baking soda paste, cortisone cream, or calamine lotion to the bite area as told by your health care provider.  Apply or take over-the-counter and prescription medicines only as told by your health care provider.  If you were prescribed an antibiotic medicine, use it as told by your health care provider. Do not stop using the antibiotic even if your condition improves.  Keep all follow-up visits as told by your health care provider. This is  important. PREVENTION   Use insect repellent. The best insect repellents contain:  DEET, picaridin, oil of lemon eucalyptus (OLE), or IR3535.  Higher amounts of an active ingredient.  When you are outdoors, wear clothing that covers your arms and legs.  Avoid opening windows that do not have window screens. SEEK MEDICAL CARE IF:  You have increased redness, swelling, or pain in the bite area.  You have a fever. SEEK IMMEDIATE MEDICAL CARE IF:   You have joint pain.   You have fluid, blood, or pus coming from the bite area.  You have a headache or neck pain.  You have unusual weakness.  You have a rash.  You have chest pain or shortness of breath.  You have abdominal pain, nausea, or vomiting.  You feel unusually tired or sleepy.   This information is not intended to replace advice given to you by your health care provider. Make sure you discuss any questions you have with your health care provider.   Document Released: 06/07/2004 Document Revised: 01/19/2015 Document Reviewed: 09/15/2014 Elsevier Interactive Patient Education 2016 [employer]  

## 2015-10-12 ENCOUNTER — Ambulatory Visit (INDEPENDENT_AMBULATORY_CARE_PROVIDER_SITE_OTHER): Payer: BLUE CROSS/BLUE SHIELD | Admitting: Family Medicine

## 2015-10-12 ENCOUNTER — Encounter: Payer: Self-pay | Admitting: Family Medicine

## 2015-10-12 VITALS — BP 118/62 | HR 66 | Temp 98.4°F | Resp 16 | Wt 249.0 lb

## 2015-10-12 DIAGNOSIS — G8929 Other chronic pain: Secondary | ICD-10-CM | POA: Diagnosis not present

## 2015-10-12 DIAGNOSIS — M222X2 Patellofemoral disorders, left knee: Secondary | ICD-10-CM

## 2015-10-12 DIAGNOSIS — M549 Dorsalgia, unspecified: Secondary | ICD-10-CM

## 2015-10-12 DIAGNOSIS — J302 Other seasonal allergic rhinitis: Secondary | ICD-10-CM | POA: Diagnosis not present

## 2015-10-12 DIAGNOSIS — R7303 Prediabetes: Secondary | ICD-10-CM | POA: Diagnosis not present

## 2015-10-12 DIAGNOSIS — E785 Hyperlipidemia, unspecified: Secondary | ICD-10-CM | POA: Diagnosis not present

## 2015-10-12 DIAGNOSIS — I10 Essential (primary) hypertension: Secondary | ICD-10-CM

## 2015-10-12 DIAGNOSIS — Z124 Encounter for screening for malignant neoplasm of cervix: Secondary | ICD-10-CM | POA: Diagnosis not present

## 2015-10-12 DIAGNOSIS — M4806 Spinal stenosis, lumbar region: Secondary | ICD-10-CM

## 2015-10-12 DIAGNOSIS — K219 Gastro-esophageal reflux disease without esophagitis: Secondary | ICD-10-CM

## 2015-10-12 DIAGNOSIS — M48061 Spinal stenosis, lumbar region without neurogenic claudication: Secondary | ICD-10-CM

## 2015-10-12 MED ORDER — OXYCODONE HCL 10 MG PO TABS: 10.0000 mg | ORAL_TABLET | ORAL | Status: DC | PRN

## 2015-10-12 NOTE — Progress Notes (Signed)
Patient ID: Cheryl Yang, female   DOB: 15-Nov-1960, 56 y.o.   MRN: [id number]    Subjective:  HPI  Patient is here for follow up. Last follow up visit was in January.  Chronic back pain: Patient did start taking 3 tablets of Gabapentin instead of 2 tablets daily. She has noticed some improvement with that. Takes Oxycodone 1 tablet at bedtime, patient takes Aleve daily.  Hypertension: Patient checks her b/p and readings are usually around 110/70. No cardiac symptoms. BP Readings from Last 3 Encounters:  10/12/15 118/62  09/28/15 118/80  06/07/15 122/76   GERD: Patient takes Omeprazole about 1 to 2 times a week when she takes a lot of Aleve for the pain.  Last labs 12/2014.  Patient states her back doctor and gynecologist retired and she needs referrals for this. Prior to Admission medications   Medication Sig Start Date End Date Taking? Authorizing Provider  BENICAR HCT 20-12.5 MG tablet 1 (one) Tablet, Oral, daily, by mouth 03/23/15  Yes Richard Maceo Pro., MD  Coenzyme Q10 (CO Q10) 100 MG CAPS Take 200 mg by mouth daily.   Yes Historical Provider, MD  diclofenac (VOLTAREN) 75 MG EC tablet Take 75 mg by mouth 2 (two) times daily. Reported on 09/28/2015   Yes Historical Provider, MD  fexofenadine (ALLEGRA) 180 MG tablet Take 180 mg by mouth daily.   Yes Historical Provider, MD  gabapentin (NEURONTIN) 300 MG capsule 2 tablets twice daily Patient taking differently: Take 300 mg by mouth. 3 tablets daily 06/07/15  Yes Richard Maceo Pro., MD  omeprazole (PRILOSEC) 20 MG capsule Take 1 capsule (20 mg total) by mouth daily as needed (for indigestion). 06/07/15  Yes Richard Maceo Pro., MD  Oxycodone HCl 10 MG TABS take 1 tablet by mouth every 4 to 6 hours 09/22/15  Yes Historical Provider, MD  rosuvastatin (CRESTOR) 5 MG tablet take 1 tablet by mouth at bedtime 07/25/15  Yes Richard Maceo Pro., MD  sertraline (ZOLOFT) 100 MG tablet take 0.5 tablet by mouth once daily 11/29/14  Yes Richard  Maceo Pro., MD  triamcinolone (NASACORT ALLERGY 24HR) 55 MCG/ACT AERO nasal inhaler Place into the nose daily.   Yes Historical Provider, MD    Patient Active Problem List   Diagnosis Date Noted  . Back pain, chronic 11/06/2014  . Essential (primary) hypertension 11/06/2014  . Fibrositis 11/06/2014  . Borderline diabetes 11/06/2014  . Depression, major, recurrent, mild ([place]) 11/06/2014  . Adiposity 11/06/2014  . HLD (hyperlipidemia) 11/06/2014  . Allergic rhinitis, seasonal 11/06/2014  . Snores 11/06/2014  . Submucous leiomyoma of uterus 11/06/2014  . Lumbar spinal stenosis 07/20/2014    Past Medical History  Diagnosis Date  . Hypertension   . Anxiety   . Depression   . GERD (gastroesophageal reflux disease)     occ, uses Prilosec as needed with anti-imflammatories  . Arthritis   . Anemia     due to fibroids (has had hysterectomy)  . Environmental and seasonal allergies   . Eczema   . PONV (postoperative nausea and vomiting)     Social History   Social History  . Marital Status: Married    Spouse Name: N/A  . Number of Children: N/A  . Years of Education: N/A   Occupational History  . Not on file.   Social History Main Topics  . Smoking status: Never Smoker   . Smokeless tobacco: Never Used  . Alcohol Use: 0.0 oz/week    0  Standard drinks or equivalent per week     Comment: very rare  . Drug Use: No  . Sexual Activity: Yes   Other Topics Concern  . Not on file   Social History Narrative    Allergies  Allergen Reactions  . Salmon [Fish Allergy] Itching  . Seldane [Terfenadine] Rash  . Tagamet [Cimetidine] Itching and Rash    Review of Systems  Constitutional: Negative.   Respiratory: Negative.   Cardiovascular: Negative.   Gastrointestinal: Negative.   Musculoskeletal: Positive for back pain and joint pain. Negative for falls.  Neurological: Positive for tingling (numbness in legs-chronic).    Immunization History  Administered Date(s)  Administered  . Influenza-Unspecified 03/20/2015  . Td 04/29/2003  . Tdap 11/23/2013   Objective:  BP 118/62 mmHg  Pulse 66  Temp(Src) 98.4 F (36.9 C)  Resp 16  Wt 249 lb (112.946 kg)  Physical Exam  Constitutional: She is oriented to person, place, and time and well-developed, well-nourished, and in no distress.  HENT:  Head: Normocephalic and atraumatic.  Right Ear: External ear normal.  Left Ear: External ear normal.  Nose: Nose normal.  Eyes: Conjunctivae are normal. Pupils are equal, round, and reactive to light.  Neck: Normal range of motion. Neck supple.  Cardiovascular: Normal rate, regular rhythm, normal heart sounds and intact distal pulses.   No murmur heard. Pulmonary/Chest: Effort normal and breath sounds normal. No respiratory distress. She has no wheezes.  Abdominal: Soft.  Musculoskeletal: She exhibits no edema.  Neurological: She is alert and oriented to person, place, and time.  Skin: Skin is warm and dry.  Psychiatric: Mood, memory, affect and judgment normal.    Lab Results  Component Value Date   WBC 8.4 12/14/2014   HGB 14.7 12/14/2014   HCT 42 12/14/2014   PLT 228 12/14/2014   GLUCOSE 127* 07/16/2014   CHOL 164 12/14/2014   TRIG 136 12/14/2014   HDL 42 12/14/2014   LDLCALC 95 12/14/2014   TSH 1.54 12/14/2014   HGBA1C 6.3 01/12/2015    CMP     Component Value Date/Time   NA 141 12/14/2014   NA 138 07/16/2014 1437   K 3.8 12/14/2014   CL 100 07/16/2014 1437   CO2 28 07/16/2014 1437   GLUCOSE 127* 07/16/2014 1437   BUN 14 12/14/2014   BUN 16 10/12/2014 1335   CREATININE 0.7 12/14/2014   CREATININE 0.83 10/12/2014 1335   CREATININE 0.97 05/28/2014 1325   CALCIUM 9.1 07/16/2014 1437   AST 26 12/14/2014   ALT 38* 12/14/2014   ALKPHOS 104 12/14/2014   GFRNONAA >60 10/12/2014 1335   GFRNONAA >60 05/28/2014 1325   GFRAA >60 10/12/2014 1335   GFRAA >60 05/28/2014 1325    Assessment and Plan :  1. Essential (primary)  hypertension Stable. - CBC with Differential/Platelet - Comprehensive metabolic panel - TSH  2. Borderline diabetes Check labs. - HgB A1c More than 50% of time spent in counselling. 3. Back pain, chronic Discussed risks of taking narcotics medications more than what she is taking. Her neurosurgeon has retired will refer to another provider in the same group. Refilled Oxycodone today x 3.  4. Lumbar spinal stenosis  5. Gastroesophageal reflux disease, esophagitis presence not specified - TSH  6. Allergic rhinitis, seasonal Stable on current regimen.  7. Cervical cancer screening Patient's gynecologist retired so will refer to another provider at [place] side since they have her records there already. - Ambulatory referral to Gynecology  8. Patella-femoral syndrome, left  New. Dicussed with patient to try and continue to exercise like riding the bike, may need to refer to ortho. Will follow. Exam is normal today.  9. Hyperlipidemia Check labs. - Comprehensive metabolic panel - Lipid Panel With LDL/HDL Ratio  Patient was seen and examined by Dr. Eulas Post and note was scribed by Theressa Millard, RMA.  I have done the exam and reviewed the above chart and it is accurate to the best of my knowledge.  Miguel Aschoff MD [facility] 10/12/2015 8:31 AM

## 2015-10-13 LAB — CBC WITH DIFFERENTIAL/PLATELET
BASOS ABS: 0 10*3/uL (ref 0.0–0.2)
BASOS: 0 %
EOS (ABSOLUTE): 0.3 10*3/uL (ref 0.0–0.4)
Eos: 4 %
HEMOGLOBIN: 14.6 g/dL (ref 11.1–15.9)
Hematocrit: 43.6 % (ref 34.0–46.6)
IMMATURE GRANS (ABS): 0 10*3/uL (ref 0.0–0.1)
IMMATURE GRANULOCYTES: 0 %
LYMPHS: 28 %
Lymphocytes Absolute: 2.5 10*3/uL (ref 0.7–3.1)
MCH: 27.9 pg (ref 26.6–33.0)
MCHC: 33.5 g/dL (ref 31.5–35.7)
MCV: 83 fL (ref 79–97)
MONOCYTES: 6 %
Monocytes Absolute: 0.5 10*3/uL (ref 0.1–0.9)
NEUTROS PCT: 62 %
Neutrophils Absolute: 5.6 10*3/uL (ref 1.4–7.0)
PLATELETS: 271 10*3/uL (ref 150–379)
RBC: 5.23 x10E6/uL (ref 3.77–5.28)
RDW: 13.9 % (ref 12.3–15.4)
WBC: 9 10*3/uL (ref 3.4–10.8)

## 2015-10-13 LAB — LIPID PANEL WITH LDL/HDL RATIO
CHOLESTEROL TOTAL: 140 mg/dL (ref 100–199)
HDL: 50 mg/dL (ref >39)
LDL CALC: 67 mg/dL (ref 0–99)
LDl/HDL Ratio: 1.3 ratio units (ref 0.0–3.2)
TRIGLYCERIDES: 115 mg/dL (ref 0–149)
VLDL CHOLESTEROL CAL: 23 mg/dL (ref 5–40)

## 2015-10-13 LAB — COMPREHENSIVE METABOLIC PANEL
ALBUMIN: 4.4 g/dL (ref 3.5–5.5)
ALT: 23 IU/L (ref 0–32)
AST: 22 IU/L (ref 0–40)
Albumin/Globulin Ratio: 1.6 (ref 1.2–2.2)
Alkaline Phosphatase: 105 IU/L (ref 39–117)
BUN/Creatinine Ratio: 26 — ABNORMAL HIGH (ref 9–23)
BUN: 21 mg/dL (ref 6–24)
Bilirubin Total: 0.6 mg/dL (ref 0.0–1.2)
CALCIUM: 9.3 mg/dL (ref 8.7–10.2)
CHLORIDE: 102 mmol/L (ref 96–106)
CO2: 24 mmol/L (ref 18–29)
CREATININE: 0.81 mg/dL (ref 0.57–1.00)
GFR, EST AFRICAN AMERICAN: 95 mL/min/{1.73_m2} (ref >59)
GFR, EST NON AFRICAN AMERICAN: 83 mL/min/{1.73_m2} (ref >59)
GLUCOSE: 117 mg/dL — AB (ref 65–99)
Globulin, Total: 2.8 g/dL (ref 1.5–4.5)
Potassium: 4.1 mmol/L (ref 3.5–5.2)
Sodium: 142 mmol/L (ref 134–144)
TOTAL PROTEIN: 7.2 g/dL (ref 6.0–8.5)

## 2015-10-13 LAB — TSH: TSH: 1.38 u[IU]/mL (ref 0.450–4.500)

## 2015-10-13 LAB — HEMOGLOBIN A1C
Est. average glucose Bld gHb Est-mCnc: 140 mg/dL
Hgb A1c MFr Bld: 6.5 % — ABNORMAL HIGH (ref 4.8–5.6)

## 2015-11-03 ENCOUNTER — Ambulatory Visit (INDEPENDENT_AMBULATORY_CARE_PROVIDER_SITE_OTHER): Payer: BLUE CROSS/BLUE SHIELD | Admitting: Family Medicine

## 2015-11-03 VITALS — BP 130/84 | HR 80 | Temp 97.5°F | Resp 16 | Wt 247.0 lb

## 2015-11-03 DIAGNOSIS — L0100 Impetigo, unspecified: Secondary | ICD-10-CM

## 2015-11-03 MED ORDER — AMOXICILLIN 500 MG PO CAPS: 500.0000 mg | ORAL_CAPSULE | Freq: Three times a day (TID) | ORAL | Status: DC

## 2015-11-03 NOTE — Progress Notes (Signed)
Cheryl Yang  MRN: [id number] DOB: 1960/09/04  Subjective:  HPI    The patient is a 55 year old female who presents for evaluation of continued and worsening rash.  She began having a rash on the right side of her face and neck.  She was seen by one of the PA's in the office at that time and it was treated as bug bites.  She also has a history of psoriasis of the scalp.  She was treated with Keflex.  The patient states that last week she began having some lesions on the left side of her face and neck, across the front of her throat and 1 lesion over her sternum.  She reports it as being a minor burning sensation with some itching.  She has replaced all her make up with new and has cleaned all of her make up brushes.  She has remained afebrile and has no lesion any lower than the one on her sternum.    The patient denies changes in any soap, lotion, or detergent.  She has not been on any new medications or antibiotics, such as Doxycyclie.  She is on Benicar HCT but has been on it for several years.  Patient Active Problem List   Diagnosis Date Noted  . Back pain, chronic 11/06/2014  . Essential (primary) hypertension 11/06/2014  . Fibrositis 11/06/2014  . Borderline diabetes 11/06/2014  . Depression, major, recurrent, mild ([place]) 11/06/2014  . Adiposity 11/06/2014  . HLD (hyperlipidemia) 11/06/2014  . Allergic rhinitis, seasonal 11/06/2014  . Snores 11/06/2014  . Submucous leiomyoma of uterus 11/06/2014  . Lumbar spinal stenosis 07/20/2014    Past Medical History  Diagnosis Date  . Hypertension   . Anxiety   . Depression   . GERD (gastroesophageal reflux disease)     occ, uses Prilosec as needed with anti-imflammatories  . Arthritis   . Anemia     due to fibroids (has had hysterectomy)  . Environmental and seasonal allergies   . Eczema   . PONV (postoperative nausea and vomiting)     Social History   Social History  . Marital Status: Married    Spouse Name: N/A  .  Number of Children: N/A  . Years of Education: N/A   Occupational History  . Not on file.   Social History Main Topics  . Smoking status: Never Smoker   . Smokeless tobacco: Never Used  . Alcohol Use: 0.0 oz/week    0 Standard drinks or equivalent per week     Comment: very rare  . Drug Use: No  . Sexual Activity: Yes   Other Topics Concern  . Not on file   Social History Narrative    Outpatient Prescriptions Prior to Visit  Medication Sig Dispense Refill  . BENICAR HCT 20-12.5 MG tablet 1 (one) Tablet, Oral, daily, by mouth 30 tablet 5  . Coenzyme Q10 (CO Q10) 100 MG CAPS Take 200 mg by mouth daily.    . diclofenac (VOLTAREN) 75 MG EC tablet Take 75 mg by mouth 2 (two) times daily. Reported on 10/12/2015    . fexofenadine (ALLEGRA) 180 MG tablet Take 180 mg by mouth daily.    Marland Kitchen gabapentin (NEURONTIN) 300 MG capsule 2 tablets twice daily (Patient taking differently: Take 300 mg by mouth. 3 tablets daily) 120 capsule 12  . omeprazole (PRILOSEC) 20 MG capsule Take 1 capsule (20 mg total) by mouth daily as needed (for indigestion). 30 capsule 12  . Oxycodone HCl  10 MG TABS Take 1 tablet (10 mg total) by mouth every 4 (four) hours as needed. 60 tablet 0  . Oxycodone HCl 10 MG TABS Take 1 tablet (10 mg total) by mouth every 4 (four) hours as needed. 60 tablet 0  . rosuvastatin (CRESTOR) 5 MG tablet take 1 tablet by mouth at bedtime 30 tablet 12  . sertraline (ZOLOFT) 100 MG tablet take 0.5 tablet by mouth once daily 30 tablet 12  . triamcinolone (NASACORT ALLERGY 24HR) 55 MCG/ACT AERO nasal inhaler Place into the nose daily.     No facility-administered medications prior to visit.    Allergies  Allergen Reactions  . Salmon [Fish Allergy] Itching  . Seldane [Terfenadine] Rash  . Tagamet [Cimetidine] Itching and Rash    Review of Systems  Constitutional: Negative for fever and malaise/fatigue.  HENT: Negative for congestion, ear discharge, ear pain, sore throat and tinnitus.     Eyes: Positive for pain, discharge and redness.       Seasonal allergies  Respiratory: Negative for cough, shortness of breath and wheezing.   Cardiovascular: Negative for chest pain, palpitations, orthopnea, claudication, leg swelling and PND.  Skin: Positive for itching and rash.  Neurological: Negative for dizziness, weakness and headaches.   Objective:  BP 130/84 mmHg  Pulse 80  Temp(Src) 97.5 F (36.4 C) (Oral)  Resp 16  Wt 247 lb (112.038 kg)  Physical Exam  Constitutional: She is oriented to person, place, and time and well-developed, well-nourished, and in no distress.  HENT:  Head: Normocephalic and atraumatic.  Right Ear: External ear normal.  Left Ear: External ear normal.  Nose: Nose normal.  Eyes: Conjunctivae are normal.  Neck: Neck supple.  Cardiovascular: Normal rate, regular rhythm and normal heart sounds.   Pulmonary/Chest: Effort normal and breath sounds normal.  Abdominal: Soft.  Lymphadenopathy:    She has no cervical adenopathy.  Neurological: She is alert and oriented to person, place, and time.  Skin: Skin is warm and dry. Rash noted.  Mild rash on posterior neck extending in the hairline of the right posterior cervical region. Minimal rash on the chest.  Rash  Scaling, erythematous.  Psychiatric: Mood, memory, affect and judgment normal.    Assessment and Plan :    1. Impetigo Refer to dermatology if she does not improve. - amoxicillin (AMOXIL) 500 MG capsule; Take 1 capsule (500 mg total) by mouth 3 (three) times daily.  Dispense: 30 capsule; Refill: [facility] 11/03/2015 9:47 AM

## 2015-11-06 ENCOUNTER — Other Ambulatory Visit: Payer: Self-pay | Admitting: Family Medicine

## 2015-12-23 ENCOUNTER — Other Ambulatory Visit: Payer: Self-pay | Admitting: Family Medicine

## 2015-12-23 ENCOUNTER — Other Ambulatory Visit: Payer: Self-pay

## 2015-12-23 MED ORDER — SERTRALINE HCL 100 MG PO TABS: 50.0000 mg | ORAL_TABLET | Freq: Every day | ORAL | 3 refills | Status: DC

## 2015-12-23 NOTE — Telephone Encounter (Signed)
Pt needs refill on her sertraline 100mg   She takes .50 mg daily.  That's a 1/2 a pill a day.  90 day supply.  [facility]  [address] [facility]  Call back is   [phone]  Thanks Con Memos

## 2015-12-28 MED ORDER — SERTRALINE HCL 100 MG PO TABS: 50.0000 mg | ORAL_TABLET | Freq: Every day | ORAL | 3 refills | Status: DC

## 2015-12-28 MED ORDER — ROSUVASTATIN CALCIUM 5 MG PO TABS: 5.0000 mg | ORAL_TABLET | Freq: Every day | ORAL | 3 refills | Status: DC

## 2015-12-28 NOTE — Telephone Encounter (Signed)
RXs refilled-aa 

## 2015-12-28 NOTE — Telephone Encounter (Signed)
Pt needs a Crestor refill 90 days.  [facility] [address]  Thanks [employer]

## 2016-01-25 ENCOUNTER — Ambulatory Visit (INDEPENDENT_AMBULATORY_CARE_PROVIDER_SITE_OTHER): Payer: BLUE CROSS/BLUE SHIELD | Admitting: Physician Assistant

## 2016-01-25 ENCOUNTER — Telehealth: Payer: Self-pay | Admitting: Physician Assistant

## 2016-01-25 ENCOUNTER — Encounter: Payer: Self-pay | Admitting: Physician Assistant

## 2016-01-25 VITALS — BP 128/84 | HR 106 | Temp 98.5°F | Resp 20 | Wt 249.0 lb

## 2016-01-25 DIAGNOSIS — R05 Cough: Secondary | ICD-10-CM | POA: Diagnosis not present

## 2016-01-25 DIAGNOSIS — I10 Essential (primary) hypertension: Secondary | ICD-10-CM | POA: Diagnosis not present

## 2016-01-25 DIAGNOSIS — R059 Cough, unspecified: Secondary | ICD-10-CM

## 2016-01-25 DIAGNOSIS — J0140 Acute pansinusitis, unspecified: Secondary | ICD-10-CM | POA: Diagnosis not present

## 2016-01-25 DIAGNOSIS — G8929 Other chronic pain: Secondary | ICD-10-CM | POA: Diagnosis not present

## 2016-01-25 DIAGNOSIS — M549 Dorsalgia, unspecified: Secondary | ICD-10-CM | POA: Diagnosis not present

## 2016-01-25 MED ORDER — OLMESARTAN MEDOXOMIL-HCTZ 20-12.5 MG PO TABS: 1.0000 | ORAL_TABLET | Freq: Every day | ORAL | 3 refills | Status: DC

## 2016-01-25 MED ORDER — HYDROCOD POLST-CPM POLST ER 10-8 MG/5ML PO SUER: 5.0000 mL | Freq: Two times a day (BID) | ORAL | 0 refills | Status: DC | PRN

## 2016-01-25 MED ORDER — GABAPENTIN 300 MG PO CAPS
600.0000 mg | ORAL_CAPSULE | Freq: Two times a day (BID) | ORAL | 3 refills | Status: DC
Start: 2016-01-25 — End: 2017-01-12

## 2016-01-25 MED ORDER — AMOXICILLIN-POT CLAVULANATE 875-125 MG PO TABS: 1.0000 | ORAL_TABLET | Freq: Two times a day (BID) | ORAL | 0 refills | Status: DC

## 2016-01-25 NOTE — Telephone Encounter (Signed)
Can she be worked in today?

## 2016-01-25 NOTE — Progress Notes (Signed)
Patient: Cheryl Yang Female    DOB: 1960-10-03   55 y.o.   MRN: [id number] Visit Date: 01/25/2016  Today's Provider: Mar Daring, PA-C   Chief Complaint  Patient presents with  . URI    X 1 week.    Subjective:    HPI Patient comes in today c/o of a URI. Patient reports that she has had symptoms for about 1 week. Patient reports that she has cough, congestion, runny nose, sore throat, ear fullness, and chills. She denies any fever, but she reports that she has felt feverish. Patient has been taking OTC alka-seltzer for her symptoms. She reports that her cough is worse at night. She does cough up sputum, but reports that it is clear. Patient reports that she does have history of allergies, and takes Allegra daily for that.   Patient is also requesting that she have Gabapentin and Benicar refilled for 90 days supply due to insurance changes.     Allergies  Allergen Reactions  . Salmon [Fish Allergy] Itching  . Seldane [Terfenadine] Rash  . Tagamet [Cimetidine] Itching and Rash     Current Outpatient Prescriptions:  .  Coenzyme Q10 (CO Q10) 100 MG CAPS, Take 200 mg by mouth daily., Disp: , Rfl:  .  diclofenac (VOLTAREN) 75 MG EC tablet, Take 75 mg by mouth 2 (two) times daily. Reported on 10/12/2015, Disp: , Rfl:  .  fexofenadine (ALLEGRA) 180 MG tablet, Take 180 mg by mouth daily., Disp: , Rfl:  .  gabapentin (NEURONTIN) 300 MG capsule, 2 tablets twice daily (Patient taking differently: Take 300 mg by mouth. 3 tablets daily), Disp: 120 capsule, Rfl: 12 .  olmesartan-hydrochlorothiazide (BENICAR HCT) 20-12.5 MG tablet, take 1 tablet by mouth once daily, Disp: 30 tablet, Rfl: 12 .  omeprazole (PRILOSEC) 20 MG capsule, Take 1 capsule (20 mg total) by mouth daily as needed (for indigestion)., Disp: 30 capsule, Rfl: 12 .  Oxycodone HCl 10 MG TABS, Take 1 tablet (10 mg total) by mouth every 4 (four) hours as needed., Disp: 60 tablet, Rfl: 0 .  rosuvastatin (CRESTOR) 5 MG  tablet, Take 1 tablet (5 mg total) by mouth at bedtime., Disp: 90 tablet, Rfl: 3 .  sertraline (ZOLOFT) 100 MG tablet, Take 0.5 tablets (50 mg total) by mouth daily., Disp: 45 tablet, Rfl: 3 .  triamcinolone (NASACORT ALLERGY 24HR) 55 MCG/ACT AERO nasal inhaler, Place into the nose daily., Disp: , Rfl:   Review of Systems  Constitutional: Positive for chills, fatigue and fever.       Felt feverish.   HENT: Positive for congestion, ear pain, facial swelling, postnasal drip, rhinorrhea, sinus pressure, sneezing, sore throat and trouble swallowing.   Respiratory: Positive for cough, shortness of breath and wheezing.   Cardiovascular: Negative.   Gastrointestinal: Negative.   Musculoskeletal: Positive for myalgias.  Neurological: Positive for headaches.    Social History  Substance Use Topics  . Smoking status: Never Smoker  . Smokeless tobacco: Never Used  . Alcohol use 0.0 oz/week     Comment: very rare   Objective:   BP 128/84 (BP Location: Right Arm, Patient Position: Sitting, Cuff Size: Large)   Pulse (!) 106   Temp 98.5 F (36.9 C)   Resp 20   Wt 249 lb (112.9 kg)   SpO2 94%   BMI 41.44 kg/m   Physical Exam  Constitutional: She appears well-developed and well-nourished. No distress.  HENT:  Head: Normocephalic and atraumatic.  Right Ear: Hearing, tympanic membrane, external ear and ear canal normal.  Left Ear: Hearing, tympanic membrane, external ear and ear canal normal.  Nose: Mucosal edema and rhinorrhea present. Right sinus exhibits maxillary sinus tenderness and frontal sinus tenderness. Left sinus exhibits maxillary sinus tenderness and frontal sinus tenderness.  Mouth/Throat: Uvula is midline and mucous membranes are normal. Posterior oropharyngeal erythema present. No oropharyngeal exudate or posterior oropharyngeal edema.  Neck: Normal range of motion. Neck supple. No tracheal deviation present. No thyromegaly present.  Cardiovascular: Normal rate, regular rhythm  and normal heart sounds.  Exam reveals no gallop and no friction rub.   No murmur heard. Pulmonary/Chest: Effort normal and breath sounds normal. No stridor. No respiratory distress. She has no wheezes. She has no rales.  Lymphadenopathy:    She has no cervical adenopathy.  Skin: She is not diaphoretic.  Vitals reviewed.     Assessment & Plan:     1. Acute pansinusitis, recurrence not specified Worsening symptoms that has not responded to over-the-counter treatments over the last week and a half. I will prescribe Augmentin as below for treatment. She may take Tylenol or ibuprofen for fevers and body aches. She is to call the office if symptoms fail to improve following treatment. - amoxicillin-clavulanate (AUGMENTIN) 875-125 MG tablet; Take 1 tablet by mouth 2 (two) times daily.  Dispense: 20 tablet; Refill: 0  2. Cough Worsening nighttime cough. I will give Tussionex as below. She is to not take her oxycodone while she is taking the cough syrup. She voiced understanding with this. I did advise her drowsiness precautions with this medication. She is to call there is no improvement. - chlorpheniramine-HYDROcodone (TUSSIONEX PENNKINETIC ER) 10-8 MG/5ML SUER; Take 5 mLs by mouth every 12 (twelve) hours as needed for cough.  Dispense: 240 mL; Refill: 0  3. Back pain, chronic Stable. Medication was pulled update to a 90 day refill instead of a 30 day refill. - gabapentin (NEURONTIN) 300 MG capsule; Take 2 capsules (600 mg total) by mouth 2 (two) times daily. 3 tablets daily  Dispense: 360 capsule; Refill: 3  4. Essential hypertension Stable. Medication was pulled to update to a 90 day refill instead of a 30 day refill. - olmesartan-hydrochlorothiazide (BENICAR HCT) 20-12.5 MG tablet; Take 1 tablet by mouth daily.  Dispense: 90 tablet; Refill: [place], PA-C  [facility]

## 2016-01-25 NOTE — Patient Instructions (Signed)

## 2016-01-25 NOTE — Telephone Encounter (Signed)
Spoke with pt and schedule appt for 1130. Thanks TNP

## 2016-01-25 NOTE — Telephone Encounter (Signed)
Pt thinks she has a sinus infection and would like to see you today. I afford the only appt available and it was to see Mikki Santee. Pt stated that she doesn't want to see Mikki Santee and would like to see if you could work her in. Please advise. Thanks TNP

## 2016-01-25 NOTE — Telephone Encounter (Signed)
1130 is about all I got

## 2016-01-29 IMAGING — RF DG LUMBAR SPINE 2-3V
1 series · 2 of 2 positions shown · IV contrast (agent unspecified)
Comparison: Lumbar radiographs 02/28/2004

CLINICAL DATA: L2-L3 PLIF

EXAM:
DG C-ARM 61-120 MIN; LUMBAR SPINE - 2-3 VIEW
TECHNIQUE: Two digital C-arm fluoroscopic images obtained intraoperatively.
CONTRAST:  None
FLUOROSCOPY TIME:  Radiation Exposure Index (as provided by the
fluoroscopic device): Not provided
If the device does not provide the exposure index:
Fluoroscopy Time (in minutes and seconds):  0 min 32 seconds
Number of Acquired Images:  2

[Series 1: run · 2 of 2 slices shown]
[im 1/2]
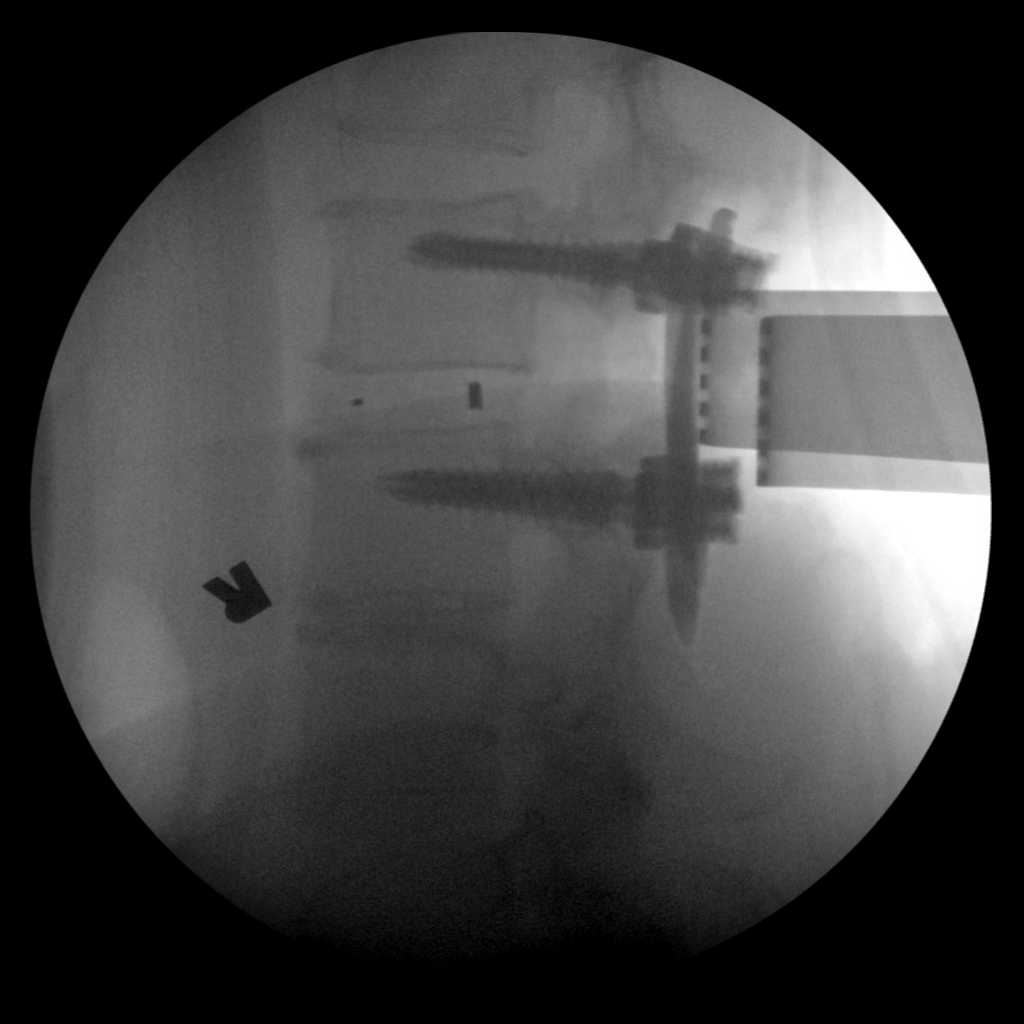
[im 2/2]
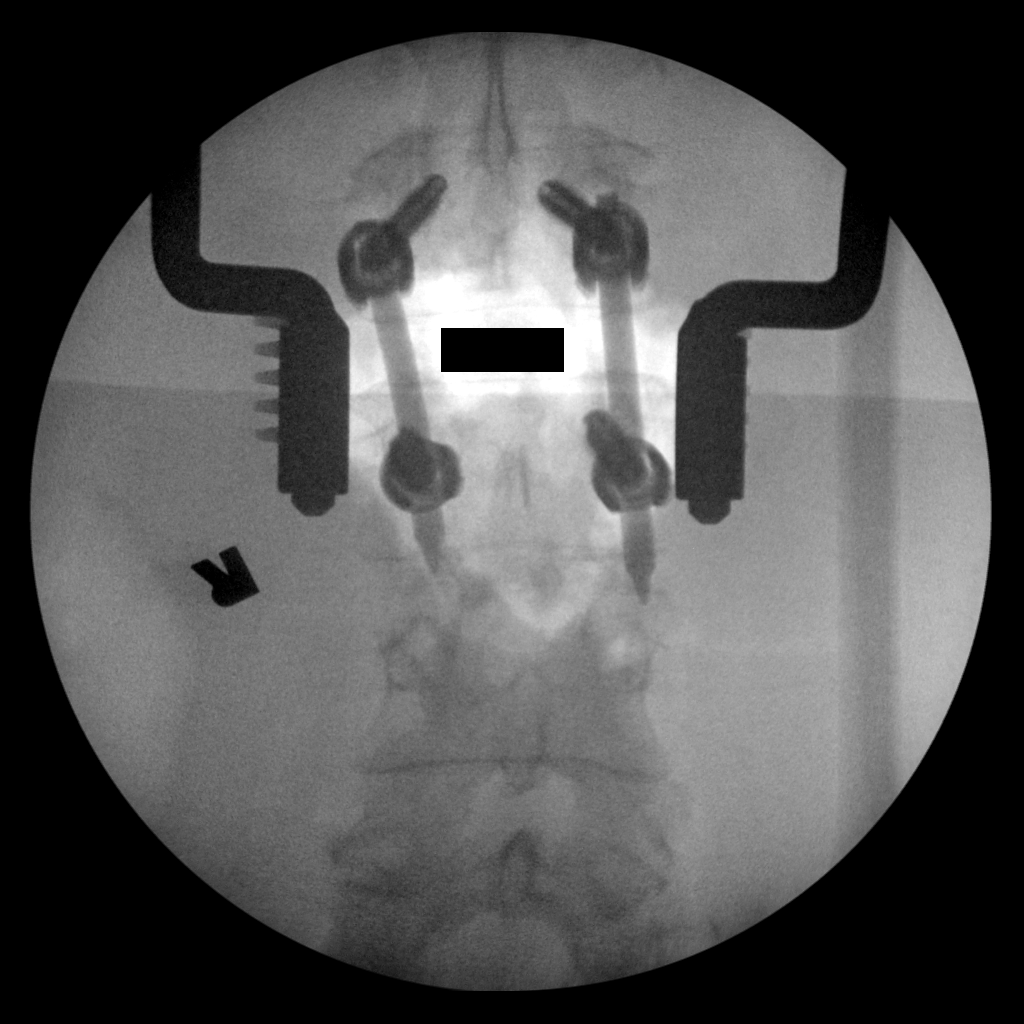

[2 of 2 positions shown; findings below may reference images not displayed]

FINDINGS: Landmarks (ribs, sacrum) are not present to accurately assign lumbar
spinal segment levels.

BILATERAL pedicle screws and bars have been placed at 2 adjacent
lumbar vertebra.

An intervening disc prosthesis is present.

Bones appear demineralized.

No fracture or subluxation seen.
IMPRESSION: Single level lumbar fusion with disc prosthesis.

No acute abnormalities.

## 2016-02-06 ENCOUNTER — Telehealth: Payer: Self-pay | Admitting: Physician Assistant

## 2016-02-06 DIAGNOSIS — J0140 Acute pansinusitis, unspecified: Secondary | ICD-10-CM

## 2016-02-06 MED ORDER — DOXYCYCLINE HYCLATE 100 MG PO TABS: 100.0000 mg | ORAL_TABLET | Freq: Two times a day (BID) | ORAL | 0 refills | Status: DC

## 2016-02-06 NOTE — Telephone Encounter (Signed)
Pt stated she was in for OV on 01/25/16 and took amoxicillin-clavulanate (AUGMENTIN) 875-125 MG tablet as directed. Pt stated that she has finished medication and is feeling a little better but not fully well. Pt would like to be advised if she should try another round of medication or something different. [facility] [address] Please advise. Thanks TNP

## 2016-02-06 NOTE — Telephone Encounter (Signed)
Doxycycline sent to [facility]

## 2016-02-06 NOTE — Telephone Encounter (Signed)
Left message to advise below.

## 2016-02-06 NOTE — Telephone Encounter (Signed)
Please review. Thanks!  

## 2016-02-06 NOTE — Telephone Encounter (Signed)
Advised patient

## 2016-04-10 ENCOUNTER — Ambulatory Visit (INDEPENDENT_AMBULATORY_CARE_PROVIDER_SITE_OTHER): Payer: BLUE CROSS/BLUE SHIELD | Admitting: Family Medicine

## 2016-04-10 VITALS — BP 118/86 | HR 84 | Temp 98.3°F | Resp 16 | Wt 249.0 lb

## 2016-04-10 DIAGNOSIS — R7303 Prediabetes: Secondary | ICD-10-CM

## 2016-04-10 DIAGNOSIS — F330 Major depressive disorder, recurrent, mild: Secondary | ICD-10-CM | POA: Diagnosis not present

## 2016-04-10 DIAGNOSIS — K219 Gastro-esophageal reflux disease without esophagitis: Secondary | ICD-10-CM

## 2016-04-10 DIAGNOSIS — M5441 Lumbago with sciatica, right side: Secondary | ICD-10-CM | POA: Diagnosis not present

## 2016-04-10 DIAGNOSIS — E784 Other hyperlipidemia: Secondary | ICD-10-CM | POA: Diagnosis not present

## 2016-04-10 DIAGNOSIS — G8929 Other chronic pain: Secondary | ICD-10-CM | POA: Diagnosis not present

## 2016-04-10 DIAGNOSIS — M5442 Lumbago with sciatica, left side: Secondary | ICD-10-CM | POA: Diagnosis not present

## 2016-04-10 DIAGNOSIS — I10 Essential (primary) hypertension: Secondary | ICD-10-CM

## 2016-04-10 DIAGNOSIS — E7849 Other hyperlipidemia: Secondary | ICD-10-CM

## 2016-04-10 LAB — POCT GLYCOSYLATED HEMOGLOBIN (HGB A1C): HEMOGLOBIN A1C: 6.3

## 2016-04-10 MED ORDER — OXYCODONE HCL 10 MG PO TABS: 10.0000 mg | ORAL_TABLET | ORAL | 0 refills | Status: DC | PRN

## 2016-04-10 NOTE — Progress Notes (Signed)
Cheryl Yang  MRN: [id number] DOB: 08-Sep-1960  Subjective:  HPI  Patient is here for 6 months follow up Patient checks her b/p and readings are usually around 110/75-80. BP Readings from Last 3 Encounters:  04/10/16 118/86  01/25/16 128/84  11/03/15 130/84  patient does not check her sugar at home. Lab Results  Component Value Date   HGBA1C 6.5 (H) 10/12/2015   Last labs were done in May 2017 routine labs. For reflux she takes Omeprazole as needed usually when she has to take a lot of Aleve (takes about 2 tablets daily) for back and leg pain. Takes Oxycodone 1 tablet at bedtime. She is still getting numbness in her legs with burning sensation also. No falls. She tries to be careful walking when she gets numb sensation. Patient Active Problem List   Diagnosis Date Noted  . Back pain, chronic 11/06/2014  . Essential (primary) hypertension 11/06/2014  . Fibrositis 11/06/2014  . Borderline diabetes 11/06/2014  . Depression, major, recurrent, mild ([place]) 11/06/2014  . Adiposity 11/06/2014  . HLD (hyperlipidemia) 11/06/2014  . Allergic rhinitis, seasonal 11/06/2014  . Snores 11/06/2014  . Submucous leiomyoma of uterus 11/06/2014  . Lumbar spinal stenosis 07/20/2014    Past Medical History:  Diagnosis Date  . Anemia    due to fibroids (has had hysterectomy)  . Anxiety   . Arthritis   . Depression   . Eczema   . Environmental and seasonal allergies   . GERD (gastroesophageal reflux disease)    occ, uses Prilosec as needed with anti-imflammatories  . Hypertension   . PONV (postoperative nausea and vomiting)     Social History   Social History  . Marital status: Married    Spouse name: N/A  . Number of children: N/A  . Years of education: N/A   Occupational History  . Not on file.   Social History Main Topics  . Smoking status: Never Smoker  . Smokeless tobacco: Never Used  . Alcohol use 0.0 oz/week     Comment: very rare  . Drug use: No  . Sexual activity:  Yes   Other Topics Concern  . Not on file   Social History Narrative  . No narrative on file    Outpatient Encounter Prescriptions as of 04/10/2016  Medication Sig Note  . Coenzyme Q10 (CO Q10) 100 MG CAPS Take 200 mg by mouth daily.   . fexofenadine (ALLEGRA) 180 MG tablet Take 180 mg by mouth daily.   Marland Kitchen gabapentin (NEURONTIN) 300 MG capsule Take 2 capsules (600 mg total) by mouth 2 (two) times daily. 3 tablets daily   . olmesartan-hydrochlorothiazide (BENICAR HCT) 20-12.5 MG tablet Take 1 tablet by mouth daily.   Marland Kitchen omeprazole (PRILOSEC) 20 MG capsule Take 1 capsule (20 mg total) by mouth daily as needed (for indigestion).   . Oxycodone HCl 10 MG TABS Take 1 tablet (10 mg total) by mouth every 4 (four) hours as needed.   . rosuvastatin (CRESTOR) 5 MG tablet Take 1 tablet (5 mg total) by mouth at bedtime.   . sertraline (ZOLOFT) 100 MG tablet Take 0.5 tablets (50 mg total) by mouth daily.   Marland Kitchen triamcinolone (NASACORT ALLERGY 24HR) 55 MCG/ACT AERO nasal inhaler Place into the nose daily. 11/06/2014: Received from: [facility]: Place into the nose.  . [DISCONTINUED] chlorpheniramine-HYDROcodone (TUSSIONEX PENNKINETIC ER) 10-8 MG/5ML SUER Take 5 mLs by mouth every 12 (twelve) hours as needed for cough.   . [DISCONTINUED] diclofenac (VOLTAREN) 75  MG EC tablet Take 75 mg by mouth 2 (two) times daily. Reported on 10/12/2015   . [DISCONTINUED] doxycycline (VIBRA-TABS) 100 MG tablet Take 1 tablet (100 mg total) by mouth 2 (two) times daily.    No facility-administered encounter medications on file as of 04/10/2016.     Allergies  Allergen Reactions  . Salmon [Fish Allergy] Itching  . Seldane [Terfenadine] Rash  . Tagamet [Cimetidine] Itching and Rash    Review of Systems  Constitutional: Negative.   Respiratory: Negative.   Cardiovascular: Negative.   Gastrointestinal: Negative.   Musculoskeletal: Positive for back pain, joint pain (leg pain) and myalgias.    Neurological: Positive for tingling (numbness and burning sensation in her legs).  Psychiatric/Behavioral: Negative.     Objective:  BP 118/86   Pulse 84   Temp 98.3 F (36.8 C)   Resp 16   Wt 249 lb (112.9 kg)   BMI 41.44 kg/m   Physical Exam  Constitutional: She is oriented to person, place, and time and well-developed, well-nourished, and in no distress.  HENT:  Head: Normocephalic and atraumatic.  Right Ear: External ear normal.  Left Ear: External ear normal.  Eyes: Conjunctivae are normal. Pupils are equal, round, and reactive to light.  Neck: Normal range of motion. Neck supple.  Cardiovascular: Normal rate, regular rhythm, normal heart sounds and intact distal pulses.   No murmur heard. Pulmonary/Chest: Effort normal and breath sounds normal. No respiratory distress. She has no wheezes.  Abdominal: Soft.  Musculoskeletal: She exhibits no edema or tenderness.  Neurological: She is alert and oriented to person, place, and time.  Skin: Skin is warm and dry.  Psychiatric: Mood, memory, affect and judgment normal.   Diabetic Foot Exam - Simple   Simple Foot Form Diabetic Foot exam was performed with the following findings:  Yes 04/10/2016  8:44 AM  Visual Inspection No deformities, no ulcerations, no other skin breakdown bilaterally:  Yes Sensation Testing Intact to touch and monofilament testing bilaterally:  Yes Pulse Check Posterior Tibialis and Dorsalis pulse intact bilaterally:  Yes Comments     Assessment and Plan :  1. Chronic bilateral low back pain with bilateral sciatica Stable. Refill given for Oxycodone.  2. Essential hypertension Stable. 3. Borderline diabetes A1C 6.3. Better. Advised patient to check her sugar once to twice a day or a week Continue working on habits.  4. Gastroesophageal reflux disease, esophagitis presence not specified Stable.   5. Depression, major, recurrent, mild (HCC) Stable.  6. Other hyperlipidemia 7.Mild  Obesity HPI, Exam and A&P transcribed under direction and in the presence of Miguel Aschoff, MD. I have done the exam and reviewed the chart and it is accurate to the best of my knowledge. Development worker, community has been used and  any errors in dictation or transcription are unintentional. Miguel Aschoff M.D. [facility]

## 2016-04-11 ENCOUNTER — Telehealth: Payer: Self-pay | Admitting: Family Medicine

## 2016-04-11 NOTE — Telephone Encounter (Signed)
Pt states she was advised to call back to tell what kind of  glucose meter she has.  Pt has a One Touch Ulta Mini meter.  Pt states the expiration on the box says 9/16.  [facility]  CB#[phone]/MW

## 2016-04-13 MED ORDER — GLUCOSE BLOOD VI STRP: ORAL_STRIP | 12 refills | Status: AC

## 2016-04-13 NOTE — Telephone Encounter (Signed)
Pt advised on voicemail that I sent in RX for lancets and strips and pharmacist could not tell me if it will be covered until they run out through insurance, I asked them to let us know if we need to switch meters-aa

## 2016-04-22 IMAGING — MR MR LUMBAR SPINE WO/W CM
4 of 7 series · 22 of 48 positions shown · IV contrast (multihance)
Comparison: MRI lumbar spine 05/28/2014. Two views lumbar spine
09/27/2014.

CLINICAL DATA: Low back and bilateral leg pain with numbness and
burning radiating to the heels.

EXAM:
MRI LUMBAR SPINE WITHOUT AND WITH CONTRAST
TECHNIQUE: Multiplanar and multiecho pulse sequences of the lumbar spine were
obtained without and with intravenous contrast.
CONTRAST:  20 mL MULTIHANCE GADOBENATE DIMEGLUMINE 529 MG/ML IV SOLN

[Series 2: T2 · sagittal · 4.0mm · 0.81mm/px · 5 of 15 slices shown (1 of 2)]
[im 1/15]
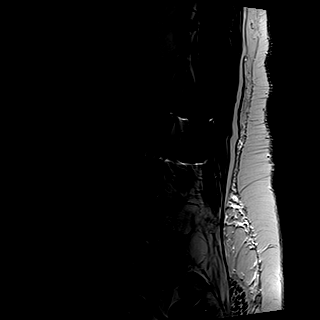
[im 4/15]
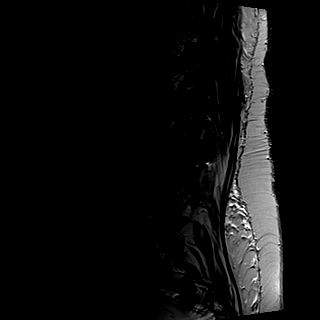
[im 8/15]
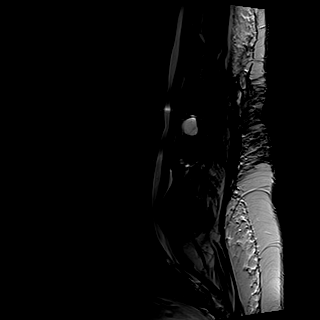
[im 11/15]
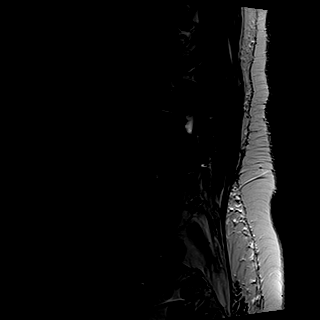
[im 15/15]
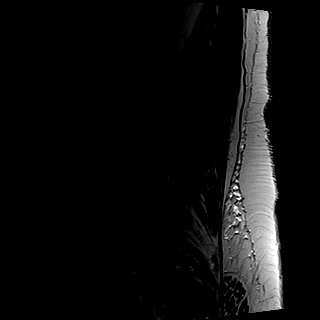

[Series 3: T1 · sagittal · 4.0mm · 0.41mm/px · 5 of 15 slices shown (1 of 2)]
[im 1/15]
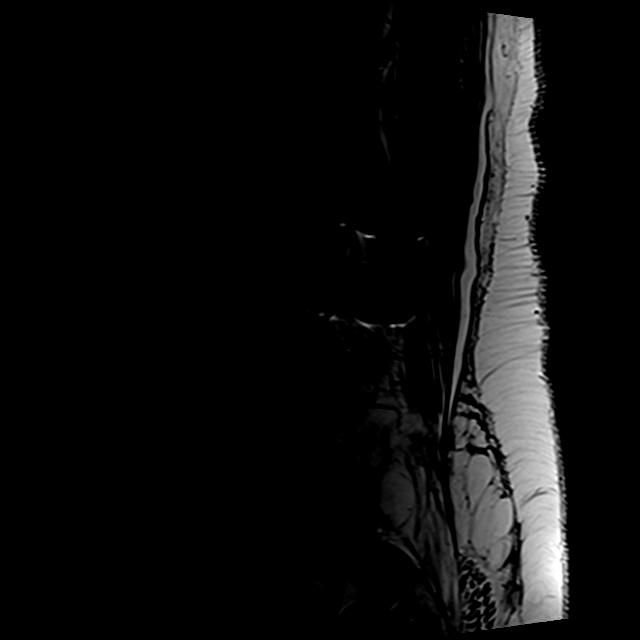
[im 4/15]
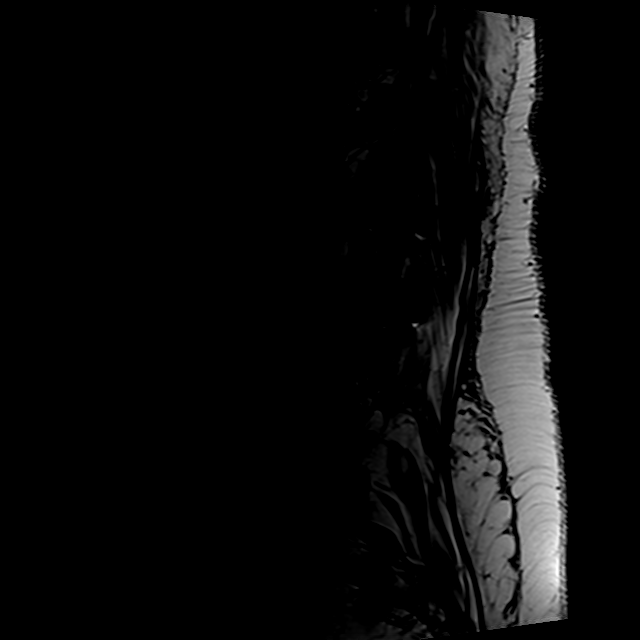
[im 8/15]
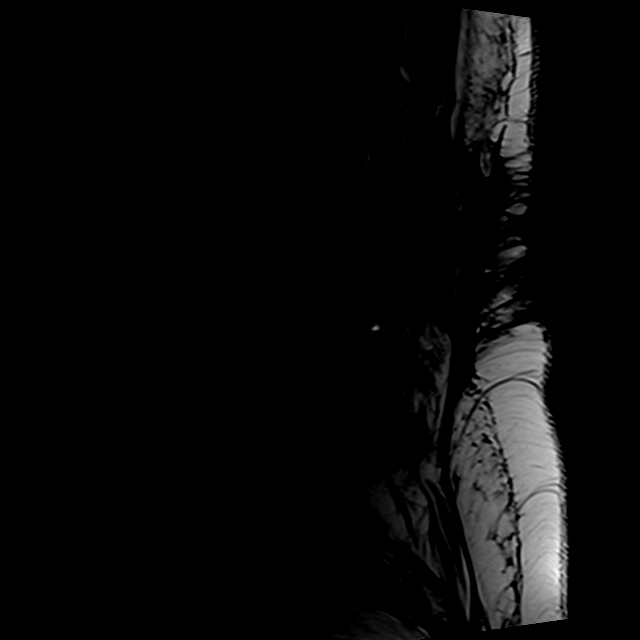
[im 11/15]
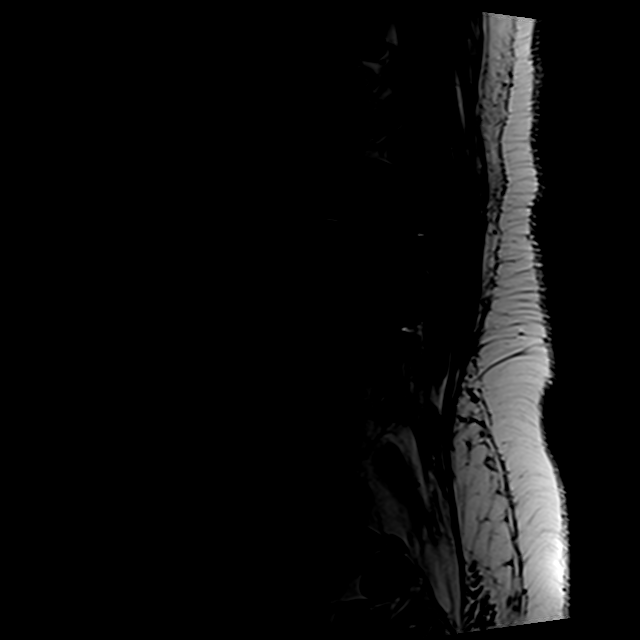
[im 15/15]
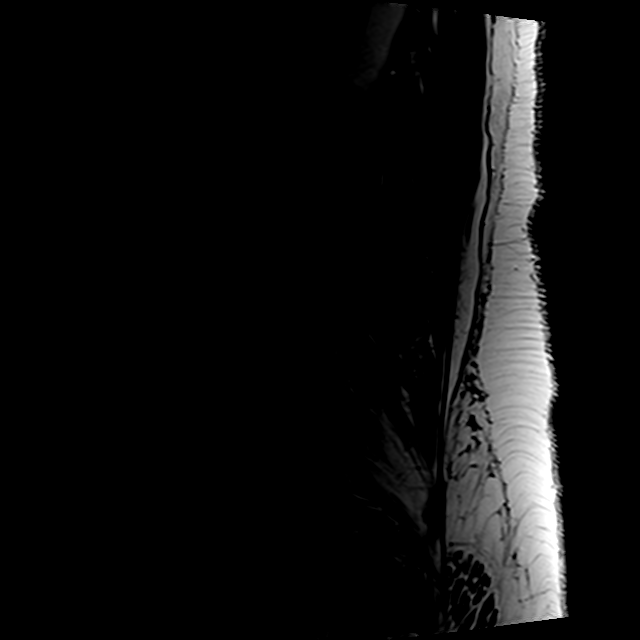

[Series 5: T2 · axial · 4.0mm · 0.78mm/px · z∈[-113,+84]mm · 8 of 36 slices shown (2 of 2)]
[im 1/36]
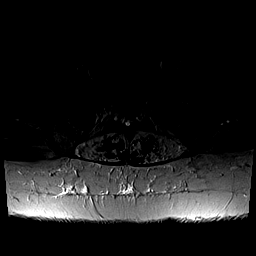
[im 4/36]
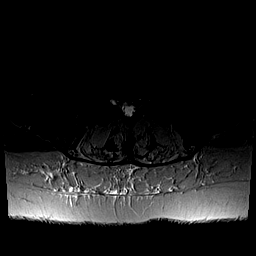
[im 12/36]
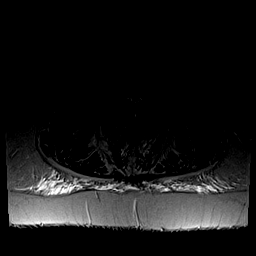
[im 16/36]
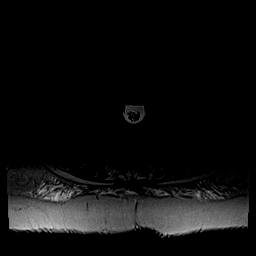
[im 20/36]
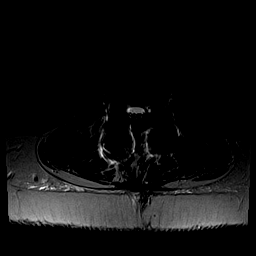
[im 24/36]
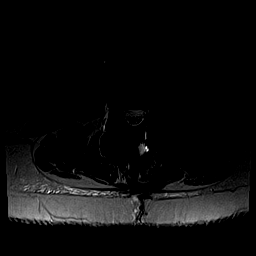
[im 32/36]
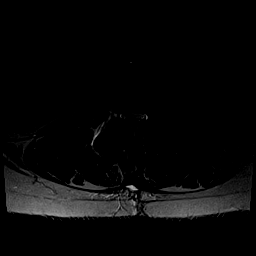
[im 36/36]
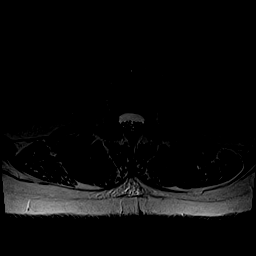

[Series 6: T1 · axial · 4.0mm · 0.31mm/px · z∈[-113,+64]mm · 4 of 36 slices shown (2 of 2)]
[im 1/36]
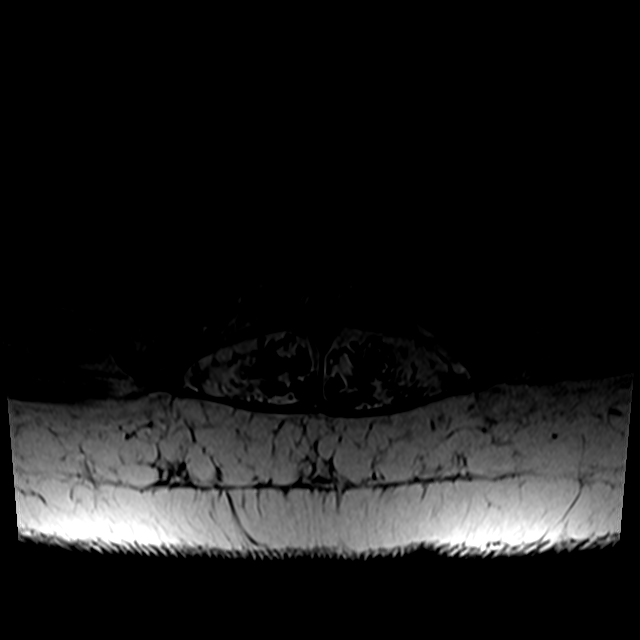
[im 4/36]
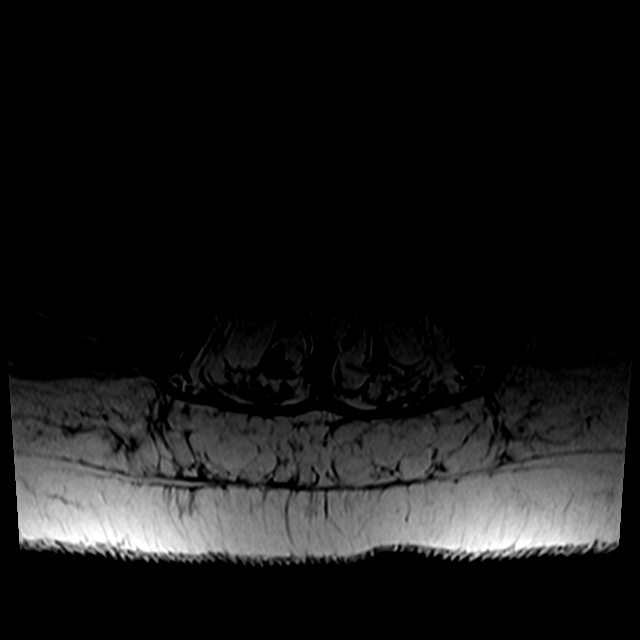
[im 20/36]
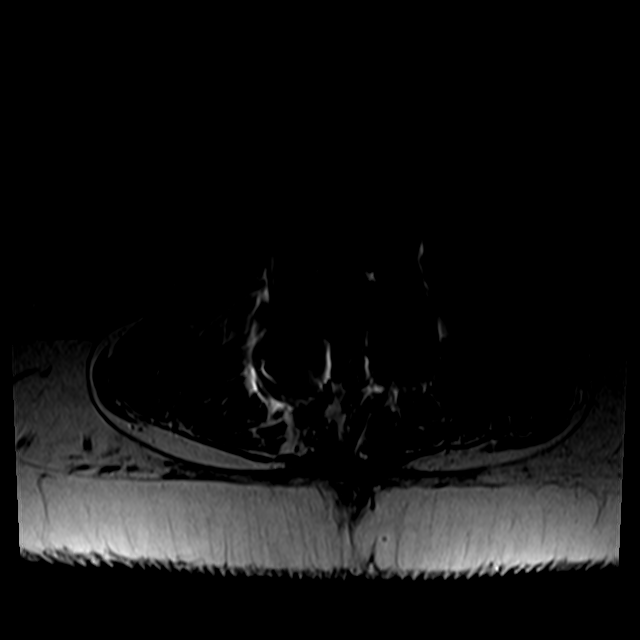
[im 32/36]
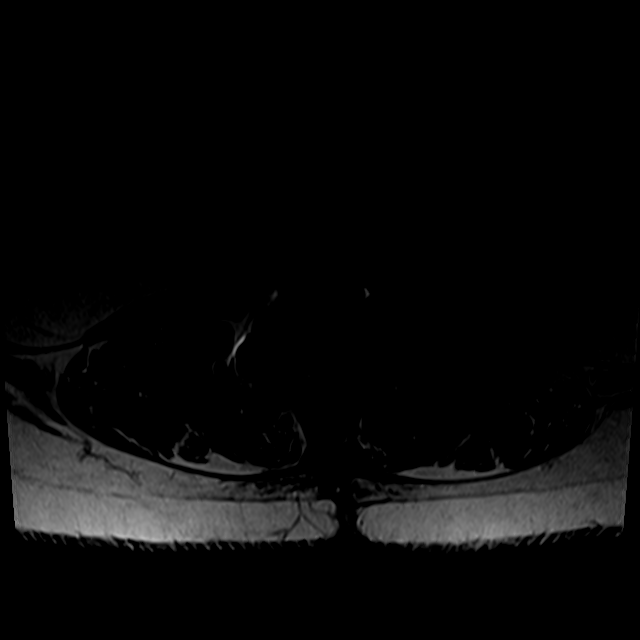

[22 of 48 positions shown; findings below may reference images not displayed]

FINDINGS: Since prior is exam, pedicle screws in L4 have been removed and
there is new postoperative change of laminectomy and fusion at L2-3.
Vertebral body height and alignment are maintained. No worrisome
marrow lesion is identified with a small hemangioma in S1 noted. The
conus medullaris is normal in signal and position. Imaged
intra-abdominal contents are unremarkable.

The T12-L1 level is imaged in the sagittal plane only and negative.

L1-2:  Negative.

L2-3: Interval laminectomy and fusion. A small fluid collection in
the laminectomy bed measuring 3.0 cm transverse by 1.4 cm AP by
cm craniocaudal is most consistent with a postoperative seroma. The
central spinal canal neural foramina are widely patent. The
appearance of this level is markedly improved.

L4-5: Status post laminectomy and fusion. The central canal and
foramina are widely patent.

L4-5: Ligamentum flavum thickening and a very mild disc bulge are
seen. Mild central canal narrowing is unchanged. The foramina are
open.

L5-S1: There is a shallow disc bulge and bilateral facet
degenerative change. The central canal and foramina are open.
IMPRESSION: No new abnormality since the prior exam.

Interval L2-3 fusion. The central canal and foramina are widely
patent and no complicating feature is identified. Small seroma in
the laminectomy bed is noted.

No change in mild central canal narrowing at L4-5 where there is
ligamentum flavum thickening, facet arthropathy and a minimal disc
bulge.

Status post L3-4 fusion as on the prior examination. The central
canal and foramina are widely patent.

## 2016-10-03 ENCOUNTER — Encounter: Payer: Self-pay | Admitting: Family Medicine

## 2016-10-03 ENCOUNTER — Ambulatory Visit (INDEPENDENT_AMBULATORY_CARE_PROVIDER_SITE_OTHER): Payer: BLUE CROSS/BLUE SHIELD | Admitting: Family Medicine

## 2016-10-03 VITALS — BP 122/80 | HR 78 | Temp 98.0°F | Resp 16 | Ht 65.0 in | Wt 253.0 lb

## 2016-10-03 DIAGNOSIS — M5441 Lumbago with sciatica, right side: Secondary | ICD-10-CM

## 2016-10-03 DIAGNOSIS — I10 Essential (primary) hypertension: Secondary | ICD-10-CM

## 2016-10-03 DIAGNOSIS — F330 Major depressive disorder, recurrent, mild: Secondary | ICD-10-CM | POA: Diagnosis not present

## 2016-10-03 DIAGNOSIS — E7800 Pure hypercholesterolemia, unspecified: Secondary | ICD-10-CM

## 2016-10-03 DIAGNOSIS — M5442 Lumbago with sciatica, left side: Secondary | ICD-10-CM

## 2016-10-03 DIAGNOSIS — K219 Gastro-esophageal reflux disease without esophagitis: Secondary | ICD-10-CM | POA: Diagnosis not present

## 2016-10-03 DIAGNOSIS — G8929 Other chronic pain: Secondary | ICD-10-CM | POA: Diagnosis not present

## 2016-10-03 DIAGNOSIS — R7303 Prediabetes: Secondary | ICD-10-CM | POA: Diagnosis not present

## 2016-10-03 MED ORDER — HYDROCODONE-ACETAMINOPHEN 5-325 MG PO TABS: 1.0000 | ORAL_TABLET | Freq: Four times a day (QID) | ORAL | 0 refills | Status: DC | PRN

## 2016-10-03 NOTE — Progress Notes (Signed)
Subjective:  HPI Pt is here for a 6 month follow up for chronic problems. She also has biometric forms that she needs filled out. She reports that we normally do all her blood work but GYN does her pap and mammogram.   Hyperglycemia- last A1c was 6.3 was better than before. Pt was to check blood sugar 1-2 times a day and eat a healthy diet and excerise. Blood sugar is running 95-130.  HTN- no changes at last OV. Last BP was 118/86. She reports that at home her Bp runs in the 110's/70's.  Chronic pain- She is still having problems with back pain and restless leg at night from her back. pt needs refills today. She takes Oxycodone once a day at night.    Depression- Pt reports that as long as she takes her medication she feels good emotionally.   Depression screen [facility] 2/9 10/03/2016  Decreased Interest 0  Down, Depressed, Hopeless 0  PHQ - 2 Score 0  Altered sleeping 0  Tired, decreased energy 0  Change in appetite 0  Feeling bad or failure about yourself  0  Trouble concentrating 0  Moving slowly or fidgety/restless 0  Suicidal thoughts 0  PHQ-9 Score 0     Prior to Admission medications   Medication Sig Start Date End Date Taking? Authorizing Provider  Coenzyme Q10 (CO Q10) 100 MG CAPS Take 200 mg by mouth daily.    [provider]  fexofenadine (ALLEGRA) 180 MG tablet Take 180 mg by mouth daily.    [provider]  gabapentin (NEURONTIN) 300 MG capsule Take 2 capsules (600 mg total) by mouth 2 (two) times daily. 3 tablets daily 01/25/16   Mar Daring, PA-C  glucose blood test strip Check sugar once daily DX E11.9, patient has one touch ultra mini and needs strips and lancets for this 04/13/16   Jerrol Banana., MD  olmesartan-hydrochlorothiazide (BENICAR HCT) 20-12.5 MG tablet Take 1 tablet by mouth daily. 01/25/16   Mar Daring, PA-C  omeprazole (PRILOSEC) 20 MG capsule Take 1 capsule (20 mg total) by mouth daily as needed (for  indigestion). 06/07/15   Jerrol Banana., MD  Oxycodone HCl 10 MG TABS Take 1 tablet (10 mg total) by mouth every 4 (four) hours as needed. 04/10/16   Jerrol Banana., MD  rosuvastatin (CRESTOR) 5 MG tablet Take 1 tablet (5 mg total) by mouth at bedtime. 12/28/15   Jerrol Banana., MD  sertraline (ZOLOFT) 100 MG tablet Take 0.5 tablets (50 mg total) by mouth daily. 12/28/15   Jerrol Banana., MD  triamcinolone (NASACORT ALLERGY 24HR) 55 MCG/ACT AERO nasal inhaler Place into the nose daily.    [provider]    Patient Active Problem List   Diagnosis Date Noted  . Back pain, chronic 11/06/2014  . Essential (primary) hypertension 11/06/2014  . Fibrositis 11/06/2014  . Borderline diabetes 11/06/2014  . Depression, major, recurrent, mild ([place]) 11/06/2014  . Adiposity 11/06/2014  . HLD (hyperlipidemia) 11/06/2014  . Allergic rhinitis, seasonal 11/06/2014  . Snores 11/06/2014  . Submucous leiomyoma of uterus 11/06/2014  . Lumbar spinal stenosis 07/20/2014    Past Medical History:  Diagnosis Date  . Anemia    due to fibroids (has had hysterectomy)  . Anxiety   . Arthritis   . Depression   . Eczema   . Environmental and seasonal allergies   . GERD (gastroesophageal reflux disease)    occ, uses  Prilosec as needed with anti-imflammatories  . Hypertension   . PONV (postoperative nausea and vomiting)     Social History   Social History  . Marital status: Married    Spouse name: N/A  . Number of children: N/A  . Years of education: N/A   Occupational History  . Not on file.   Social History Main Topics  . Smoking status: Never Smoker  . Smokeless tobacco: Never Used  . Alcohol use 0.0 oz/week     Comment: very rare  . Drug use: No  . Sexual activity: Yes   Other Topics Concern  . Not on file   Social History Narrative  . No narrative on file    Allergies  Allergen Reactions  . Salmon [Fish Allergy] Itching  . Seldane  [Terfenadine] Rash  . Tagamet [Cimetidine] Itching and Rash    Review of Systems  Constitutional: Negative.   HENT: Negative.   Eyes: Negative.   Respiratory: Negative.   Cardiovascular: Negative.   Gastrointestinal: Negative.   Musculoskeletal: Positive for back pain.  Skin: Negative.   Neurological: Negative.   Endo/Heme/Allergies: Negative.   Psychiatric/Behavioral: Negative.     Immunization History  Administered Date(s) Administered  . Influenza-Unspecified 03/20/2015  . Td 04/29/2003  . Tdap 11/23/2013    Objective:  BP 122/80 (BP Location: Left Arm, Patient Position: Sitting, Cuff Size: Large)   Pulse 78   Temp 98 F (36.7 C) (Oral)   Resp 16   Ht 5\' 5"  (1.651 m)   Wt 253 lb (114.8 kg)   BMI 42.10 kg/m   Depression screen [facility] 2/9 10/03/2016  Decreased Interest 0  Down, Depressed, Hopeless 0  PHQ - 2 Score 0  Altered sleeping 0  Tired, decreased energy 0  Change in appetite 0  Feeling bad or failure about yourself  0  Trouble concentrating 0  Moving slowly or fidgety/restless 0  Suicidal thoughts 0  PHQ-9 Score 0       Physical Exam  Constitutional: She is oriented to person, place, and time and well-developed, well-nourished, and in no distress.  HENT:  Head: Normocephalic and atraumatic.  Right Ear: External ear normal.  Left Ear: External ear normal.  Nose: Nose normal.  Eyes: Conjunctivae and EOM are normal. Pupils are equal, round, and reactive to light.  Neck: Normal range of motion. Neck supple.  Cardiovascular: Normal rate, regular rhythm, normal heart sounds and intact distal pulses.   Pulmonary/Chest: Effort normal and breath sounds normal.  Abdominal: Soft.  Musculoskeletal: Normal range of motion.  Neurological: She is alert and oriented to person, place, and time. She has normal reflexes. Gait normal. GCS score is 15.  Skin: Skin is warm and dry.  Psychiatric: Mood, memory, affect and judgment normal.    Lab Results  Component  Value Date   WBC 9.0 10/12/2015   HGB 14.7 12/14/2014   HCT 43.6 10/12/2015   PLT 271 10/12/2015   GLUCOSE 117 (H) 10/12/2015   CHOL 140 10/12/2015   TRIG 115 10/12/2015   HDL 50 10/12/2015   LDLCALC 67 10/12/2015   TSH 1.380 10/12/2015   HGBA1C 6.3 04/10/2016    CMP     Component Value Date/Time   NA 142 10/12/2015 0905   K 4.1 10/12/2015 0905   CL 102 10/12/2015 0905   CO2 24 10/12/2015 0905   GLUCOSE 117 (H) 10/12/2015 0905   GLUCOSE 127 (H) 07/16/2014 1437   BUN 21 10/12/2015 0905   CREATININE 0.81 10/12/2015 0905  CREATININE 0.97 05/28/2014 1325   CALCIUM 9.3 10/12/2015 0905   PROT 7.2 10/12/2015 0905   ALBUMIN 4.4 10/12/2015 0905   AST 22 10/12/2015 0905   ALT 23 10/12/2015 0905   ALKPHOS 105 10/12/2015 0905   BILITOT 0.6 10/12/2015 0905   GFRNONAA 83 10/12/2015 0905   GFRNONAA >60 05/28/2014 1325   GFRAA 95 10/12/2015 0905   GFRAA >60 05/28/2014 1325    Assessment and Plan :  1. Essential hypertension  - CBC with Differential/Platelet - TSH  2. Borderline diabetes  - Hemoglobin A1c  3. Gastroesophageal reflux disease, esophagitis presence not specified   4. Depression, major, recurrent, mild ([place])   5. Chronic bilateral low back pain with bilateral sciatica Will step down to hydrocodone. May consider Tramadol in the future.  - HYDROcodone-acetaminophen (NORCO/VICODIN) 5-325 MG tablet; Take 1 tablet by mouth every 6 (six) hours as needed for moderate pain.  Dispense: 120 tablet; Refill: 0 More than 50% of visit spent in counselling. 6. Pure hypercholesterolemia  - Lipid Panel With LDL/HDL Ratio - Comprehensive metabolic panel   HPI, Exam, and A&P Transcribed under the direction and in the presence of Richard L. Cranford Mon, MD  Electronically Signed: Katina Dung, [place] MD [facility] 10/03/2016 3:57 PM

## 2016-10-09 ENCOUNTER — Ambulatory Visit: Payer: BLUE CROSS/BLUE SHIELD | Admitting: Family Medicine

## 2016-10-17 DIAGNOSIS — R7303 Prediabetes: Secondary | ICD-10-CM | POA: Diagnosis not present

## 2016-10-17 DIAGNOSIS — E7800 Pure hypercholesterolemia, unspecified: Secondary | ICD-10-CM | POA: Diagnosis not present

## 2016-10-17 DIAGNOSIS — I10 Essential (primary) hypertension: Secondary | ICD-10-CM | POA: Diagnosis not present

## 2016-10-18 LAB — COMPREHENSIVE METABOLIC PANEL
ALBUMIN: 4.6 g/dL (ref 3.5–5.5)
ALK PHOS: 105 IU/L (ref 39–117)
ALT: 27 IU/L (ref 0–32)
AST: 22 IU/L (ref 0–40)
Albumin/Globulin Ratio: 1.6 (ref 1.2–2.2)
BILIRUBIN TOTAL: 0.8 mg/dL (ref 0.0–1.2)
BUN/Creatinine Ratio: 19 (ref 9–23)
BUN: 16 mg/dL (ref 6–24)
CHLORIDE: 100 mmol/L (ref 96–106)
CO2: 24 mmol/L (ref 18–29)
CREATININE: 0.84 mg/dL (ref 0.57–1.00)
Calcium: 9.5 mg/dL (ref 8.7–10.2)
GFR calc Af Amer: 90 mL/min/{1.73_m2} (ref >59)
GFR calc non Af Amer: 78 mL/min/{1.73_m2} (ref >59)
GLUCOSE: 106 mg/dL — AB (ref 65–99)
Globulin, Total: 2.8 g/dL (ref 1.5–4.5)
Potassium: 4.3 mmol/L (ref 3.5–5.2)
Sodium: 143 mmol/L (ref 134–144)
TOTAL PROTEIN: 7.4 g/dL (ref 6.0–8.5)

## 2016-10-18 LAB — CBC WITH DIFFERENTIAL/PLATELET
Basophils Absolute: 0 10*3/uL (ref 0.0–0.2)
Basos: 0 %
EOS (ABSOLUTE): 0.2 10*3/uL (ref 0.0–0.4)
Eos: 3 %
HEMATOCRIT: 42 % (ref 34.0–46.6)
Hemoglobin: 14 g/dL (ref 11.1–15.9)
IMMATURE GRANULOCYTES: 0 %
Immature Grans (Abs): 0 10*3/uL (ref 0.0–0.1)
LYMPHS ABS: 2.7 10*3/uL (ref 0.7–3.1)
Lymphs: 32 %
MCH: 28.2 pg (ref 26.6–33.0)
MCHC: 33.3 g/dL (ref 31.5–35.7)
MCV: 85 fL (ref 79–97)
MONOS ABS: 0.7 10*3/uL (ref 0.1–0.9)
Monocytes: 8 %
NEUTROS PCT: 57 %
Neutrophils Absolute: 4.9 10*3/uL (ref 1.4–7.0)
PLATELETS: 283 10*3/uL (ref 150–379)
RBC: 4.96 x10E6/uL (ref 3.77–5.28)
RDW: 14 % (ref 12.3–15.4)
WBC: 8.5 10*3/uL (ref 3.4–10.8)

## 2016-10-18 LAB — LIPID PANEL WITH LDL/HDL RATIO
CHOLESTEROL TOTAL: 145 mg/dL (ref 100–199)
HDL: 43 mg/dL (ref >39)
LDL CALC: 78 mg/dL (ref 0–99)
LDl/HDL Ratio: 1.8 ratio (ref 0.0–3.2)
Triglycerides: 119 mg/dL (ref 0–149)
VLDL CHOLESTEROL CAL: 24 mg/dL (ref 5–40)

## 2016-10-18 LAB — HEMOGLOBIN A1C
ESTIMATED AVERAGE GLUCOSE: 134 mg/dL
Hgb A1c MFr Bld: 6.3 % — ABNORMAL HIGH (ref 4.8–5.6)

## 2016-10-18 LAB — TSH: TSH: 1.1 u[IU]/mL (ref 0.450–4.500)

## 2016-10-23 ENCOUNTER — Telehealth: Payer: Self-pay | Admitting: Emergency Medicine

## 2016-10-23 NOTE — Telephone Encounter (Signed)
Tried to call pt LMTCB. She needs to come and pick up her paper per the paper work. It can not be faxed. It can be mailed but in the envelope that was provided with the paper work.

## 2016-10-23 NOTE — Progress Notes (Signed)
Advised  ED 

## 2016-10-31 ENCOUNTER — Telehealth: Payer: Self-pay | Admitting: Family Medicine

## 2016-10-31 NOTE — Telephone Encounter (Signed)
Pt is returning call.  [phone]

## 2016-10-31 NOTE — Telephone Encounter (Signed)
Pt advised to pick up her paper work-aa

## 2016-11-08 ENCOUNTER — Ambulatory Visit: Payer: BLUE CROSS/BLUE SHIELD | Admitting: Family Medicine

## 2016-12-16 ENCOUNTER — Other Ambulatory Visit: Payer: Self-pay | Admitting: Family Medicine

## 2017-01-12 ENCOUNTER — Other Ambulatory Visit: Payer: Self-pay | Admitting: Physician Assistant

## 2017-01-12 DIAGNOSIS — M549 Dorsalgia, unspecified: Principal | ICD-10-CM

## 2017-01-12 DIAGNOSIS — G8929 Other chronic pain: Secondary | ICD-10-CM

## 2017-01-12 DIAGNOSIS — I10 Essential (primary) hypertension: Secondary | ICD-10-CM

## 2017-02-01 ENCOUNTER — Other Ambulatory Visit: Payer: Self-pay | Admitting: Emergency Medicine

## 2017-02-01 DIAGNOSIS — M5442 Lumbago with sciatica, left side: Principal | ICD-10-CM

## 2017-02-01 DIAGNOSIS — G8929 Other chronic pain: Secondary | ICD-10-CM

## 2017-02-01 DIAGNOSIS — M5441 Lumbago with sciatica, right side: Principal | ICD-10-CM

## 2017-02-01 NOTE — Telephone Encounter (Signed)
Pt is aware that you are out of the office today.

## 2017-02-04 MED ORDER — HYDROCODONE-ACETAMINOPHEN 5-325 MG PO TABS: 1.0000 | ORAL_TABLET | Freq: Four times a day (QID) | ORAL | 0 refills | Status: DC | PRN

## 2017-03-14 ENCOUNTER — Other Ambulatory Visit: Payer: Self-pay | Admitting: Family Medicine

## 2017-03-21 ENCOUNTER — Other Ambulatory Visit: Payer: Self-pay | Admitting: Obstetrics and Gynecology

## 2017-03-21 DIAGNOSIS — Z1231 Encounter for screening mammogram for malignant neoplasm of breast: Secondary | ICD-10-CM

## 2017-03-21 DIAGNOSIS — Z01419 Encounter for gynecological examination (general) (routine) without abnormal findings: Secondary | ICD-10-CM | POA: Diagnosis not present

## 2017-04-10 ENCOUNTER — Ambulatory Visit: Payer: BLUE CROSS/BLUE SHIELD | Admitting: Family Medicine

## 2017-04-11 ENCOUNTER — Ambulatory Visit: Payer: BLUE CROSS/BLUE SHIELD | Admitting: Family Medicine

## 2017-04-24 ENCOUNTER — Ambulatory Visit
Admission: RE | Admit: 2017-04-24 | Discharge: 2017-04-24 | Disposition: A | Discharge status: Home / Self Care | Payer: BLUE CROSS/BLUE SHIELD | Source: Ambulatory Visit | Attending: Obstetrics and Gynecology | Admitting: Obstetrics and Gynecology

## 2017-04-24 DIAGNOSIS — Z1231 Encounter for screening mammogram for malignant neoplasm of breast: Secondary | ICD-10-CM | POA: Insufficient documentation

## 2017-05-01 ENCOUNTER — Inpatient Hospital Stay
Admission: RE | Admit: 2017-05-01 | Discharge: 2017-05-01 | Disposition: A | Discharge status: Home / Self Care | Payer: Self-pay | Source: Ambulatory Visit | Attending: *Deleted | Admitting: *Deleted

## 2017-05-01 ENCOUNTER — Other Ambulatory Visit: Payer: Self-pay | Admitting: *Deleted

## 2017-05-01 DIAGNOSIS — Z9289 Personal history of other medical treatment: Secondary | ICD-10-CM

## 2017-05-22 ENCOUNTER — Ambulatory Visit: Payer: BLUE CROSS/BLUE SHIELD | Admitting: Family Medicine

## 2017-05-22 ENCOUNTER — Other Ambulatory Visit: Payer: Self-pay

## 2017-05-22 VITALS — BP 132/88 | HR 76 | Temp 98.3°F | Resp 16 | Wt 256.0 lb

## 2017-05-22 DIAGNOSIS — M5442 Lumbago with sciatica, left side: Secondary | ICD-10-CM

## 2017-05-22 DIAGNOSIS — R739 Hyperglycemia, unspecified: Secondary | ICD-10-CM

## 2017-05-22 DIAGNOSIS — M5441 Lumbago with sciatica, right side: Secondary | ICD-10-CM

## 2017-05-22 DIAGNOSIS — G8929 Other chronic pain: Secondary | ICD-10-CM | POA: Diagnosis not present

## 2017-05-22 DIAGNOSIS — M48061 Spinal stenosis, lumbar region without neurogenic claudication: Secondary | ICD-10-CM

## 2017-05-22 LAB — POCT GLYCOSYLATED HEMOGLOBIN (HGB A1C): Hemoglobin A1C: 6.4

## 2017-05-22 MED ORDER — HYDROCODONE-ACETAMINOPHEN 5-325 MG PO TABS: 1.0000 | ORAL_TABLET | Freq: Four times a day (QID) | ORAL | 0 refills | Status: DC | PRN
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
## 2017-10-02 ENCOUNTER — Encounter: Payer: Self-pay | Admitting: Family Medicine

## 2017-10-02 ENCOUNTER — Ambulatory Visit (INDEPENDENT_AMBULATORY_CARE_PROVIDER_SITE_OTHER): Payer: BLUE CROSS/BLUE SHIELD | Admitting: Family Medicine

## 2017-10-02 VITALS — BP 128/80 | HR 78 | Temp 98.5°F | Resp 14 | Ht 65.0 in | Wt 255.0 lb

## 2017-10-02 DIAGNOSIS — M5441 Lumbago with sciatica, right side: Secondary | ICD-10-CM

## 2017-10-02 DIAGNOSIS — I10 Essential (primary) hypertension: Secondary | ICD-10-CM | POA: Diagnosis not present

## 2017-10-02 DIAGNOSIS — G8929 Other chronic pain: Secondary | ICD-10-CM | POA: Diagnosis not present

## 2017-10-02 DIAGNOSIS — R739 Hyperglycemia, unspecified: Secondary | ICD-10-CM

## 2017-10-02 DIAGNOSIS — B354 Tinea corporis: Secondary | ICD-10-CM

## 2017-10-02 DIAGNOSIS — Z0000 Encounter for general adult medical examination without abnormal findings: Secondary | ICD-10-CM

## 2017-10-02 DIAGNOSIS — E7849 Other hyperlipidemia: Secondary | ICD-10-CM

## 2017-10-02 DIAGNOSIS — M5442 Lumbago with sciatica, left side: Secondary | ICD-10-CM | POA: Diagnosis not present

## 2017-10-02 MED ORDER — CLOTRIMAZOLE-BETAMETHASONE 1-0.05 % EX CREA: 1.0000 "application " | TOPICAL_CREAM | Freq: Two times a day (BID) | CUTANEOUS | 0 refills | Status: AC

## 2017-10-02 NOTE — Progress Notes (Signed)
Patient: Cheryl Yang, Female    DOB: 04-23-1961, 57 y.o.   MRN: [id number] Visit Date: 10/02/2017  Today's Provider: [name], MD   Chief Complaint  Patient presents with  . Annual Exam   Subjective:    Annual physical exam Cheryl Yang is a 57 y.o. female who presents today for health maintenance and complete physical. She feels fairly well. She reports exercising, some but not a lot due to her toe pain and back. She reports she is sleeping fairly well, about her normal.  ----------------------------------------------------------------- Colonoscopy- 07/07/12 [facility] Internal hemorrhoids repeat 5-10 years Mammogram- 04/24/17 negative Pap 08/25/08 S/p hysterectomy   Pt gets mammogram and pap smear at GYN  Review of Systems  Constitutional: Negative.   HENT: Positive for sinus pressure, sneezing and sore throat.   Eyes: Positive for pain, discharge and itching.  Respiratory: Negative.   Cardiovascular: Negative.   Gastrointestinal: Negative.   Endocrine: Negative.   Genitourinary: Negative.   Musculoskeletal: Positive for back pain.  Skin: Negative.   Allergic/Immunologic: Positive for environmental allergies.  Neurological: Negative.   Hematological: Negative.   Psychiatric/Behavioral: Negative.     Social History      She  reports that she has never smoked. She has never used smokeless tobacco. She reports that she drinks alcohol. She reports that she does not use drugs.       Social History   Socioeconomic History  . Marital status: Married    Spouse name: Not on file  . Number of children: Not on file  . Years of education: Not on file  . Highest education level: Not on file  Occupational History  . Not on file  Social Needs  . Financial resource strain: Not on file  . Food insecurity:    Worry: Not on file    Inability: Not on file  . Transportation needs:    Medical: Not on file    Non-medical: Not on file  Tobacco Use  . Smoking  status: Never Smoker  . Smokeless tobacco: Never Used  Substance and Sexual Activity  . Alcohol use: Yes    Alcohol/week: 0.0 oz    Comment: very rare  . Drug use: No  . Sexual activity: Yes  Lifestyle  . Physical activity:    Days per week: Not on file    Minutes per session: Not on file  . Stress: Not on file  Relationships  . Social connections:    Talks on phone: Not on file    Gets together: Not on file    Attends religious service: Not on file    Active member of club or organization: Not on file    Attends meetings of clubs or organizations: Not on file    Relationship status: Not on file  Other Topics Concern  . Not on file  Social History Narrative  . Not on file    Past Medical History:  Diagnosis Date  . Anemia    due to fibroids (has had hysterectomy)  . Anxiety   . Arthritis   . Depression   . Eczema   . Environmental and seasonal allergies   . GERD (gastroesophageal reflux disease)    occ, uses Prilosec as needed with anti-imflammatories  . Hypertension   . PONV (postoperative nausea and vomiting)      Patient Active Problem List   Diagnosis Date Noted  . Back pain, chronic 11/06/2014  . Essential (primary) hypertension 11/06/2014  .  Fibrositis 11/06/2014  . Borderline diabetes 11/06/2014  . Depression, major, recurrent, mild ([place]) 11/06/2014  . Adiposity 11/06/2014  . HLD (hyperlipidemia) 11/06/2014  . Allergic rhinitis, seasonal 11/06/2014  . Snores 11/06/2014  . Submucous leiomyoma of uterus 11/06/2014  . Lumbar spinal stenosis 07/20/2014    Past Surgical History:  Procedure Laterality Date  . BACK SURGERY     lumbar surgery  . COLONOSCOPY    . EYE SURGERY Left    had hole in eye repaired  . VAGINAL HYSTERECTOMY      Family History        Family Status  Relation Name Status  . Mother  Alive  . Father  Deceased       due to MVA  . Brother  Alive  . Daughter  Alive  . Son  Alive  . Brother  Alive        Her family history  includes CVA in her mother; Heart attack in her mother; Restless legs syndrome in her mother.      Allergies  Allergen Reactions  . Salmon [Fish Allergy] Itching  . Seldane [Terfenadine] Rash  . Tagamet [Cimetidine] Itching and Rash     Current Outpatient Medications:  .  Coenzyme Q10 (CO Q10) 100 MG CAPS, Take 200 mg by mouth daily., Disp: , Rfl:  .  fexofenadine (ALLEGRA) 180 MG tablet, Take 180 mg by mouth daily., Disp: , Rfl:  .  gabapentin (NEURONTIN) 300 MG capsule, TAKE 2 CAPSULES (600 MG TOTAL) BY MOUTH 2 (TWO) TIMES DAILY. 3 TABLETS DAILY (Patient taking differently: Take 600 mg by mouth 2 (two) times daily. And 1 sometimes 2 at bedtime), Disp: 360 capsule, Rfl: 3 .  glucose blood test strip, Check sugar once daily DX E11.9, patient has one touch ultra mini and needs strips and lancets for this, Disp: 50 each, Rfl: 12 .  HYDROcodone-acetaminophen (NORCO/VICODIN) 5-325 MG tablet, Take 1 tablet by mouth every 6 (six) hours as needed for moderate pain., Disp: 120 tablet, Rfl: 0 .  olmesartan-hydrochlorothiazide (BENICAR HCT) 20-12.5 MG tablet, TAKE 1 TABLET BY MOUTH DAILY., Disp: 90 tablet, Rfl: 3 .  rosuvastatin (CRESTOR) 5 MG tablet, TAKE 1 TABLET (5 MG TOTAL) BY MOUTH AT BEDTIME., Disp: 90 tablet, Rfl: 3 .  sertraline (ZOLOFT) 100 MG tablet, TAKE 0.5 TABLETS (50 MG TOTAL) BY MOUTH DAILY., Disp: 45 tablet, Rfl: 12 .  triamcinolone (NASACORT ALLERGY 24HR) 55 MCG/ACT AERO nasal inhaler, Place into the nose daily., Disp: , Rfl:  .  omeprazole (PRILOSEC) 20 MG capsule, Take 1 capsule (20 mg total) by mouth daily as needed (for indigestion). (Patient not taking: Reported on 10/02/2017), Disp: 30 capsule, Rfl: 12   Patient Care Team: Jerrol Banana., MD as PCP - General (Family Medicine)      Objective:   Vitals: BP 128/80 (BP Location: Left Arm, Patient Position: Sitting, Cuff Size: Large)   Pulse 78   Temp 98.5 F (36.9 C) (Oral)   Resp 14   Ht 5\' 5"  (1.651 m)   Wt 255 lb  (115.7 kg)   BMI 42.43 kg/m    Vitals:   10/02/17 0842  BP: 128/80  Pulse: 78  Resp: 14  Temp: 98.5 F (36.9 C)  TempSrc: Oral  Weight: 255 lb (115.7 kg)  Height: 5\' 5"  (1.651 m)     Physical Exam  Constitutional: She is oriented to person, place, and time. She appears well-developed and well-nourished.  HENT:  Head: Normocephalic and atraumatic.  Eyes: Conjunctivae are normal. No scleral icterus.  Neck: No thyromegaly present.  Cardiovascular: Normal rate, regular rhythm and normal heart sounds.  Pulmonary/Chest: Effort normal and breath sounds normal.  Abdominal: Soft.  Musculoskeletal: She exhibits no edema.  Neurological: She is alert and oriented to person, place, and time.  Skin: Skin is warm and dry.  Psychiatric: She has a normal mood and affect. Her behavior is normal. Judgment and thought content normal.     Depression Screen PHQ 2/9 Scores 10/02/2017 10/03/2016  PHQ - 2 Score 0 0  PHQ- 9 Score 3 0      Assessment & Plan:     Routine Health Maintenance and Physical Exam  Exercise Activities and Dietary recommendations Goals    None      Immunization History  Administered Date(s) Administered  . Influenza-Unspecified 03/20/2015, 02/11/2017  . Td 04/29/2003  . Tdap 11/23/2013    Health Maintenance  Topic Date Due  . HIV Screening  04/11/1976  . INFLUENZA VACCINE  12/12/2017  . PAP SMEAR  08/13/2018  . MAMMOGRAM  04/25/2019  . COLONOSCOPY  07/07/2022  . TETANUS/TDAP  11/24/2023  . Hepatitis C Screening  Completed     Discussed health benefits of physical activity, and encouraged her to engage in regular exercise appropriate for her age and condition.  Chronic Back Pain Right Leg Tinea Corporis Lotrisone cream.    --------------------------------------------------------------------   I have done the exam and reviewed the above chart and it is accurate to the best of my knowledge. Development worker, community has been used in this note in any  air is in the dictation or transcription are unintentional.  [name], MD  [place]

## 2017-10-03 ENCOUNTER — Telehealth: Payer: Self-pay

## 2017-10-03 DIAGNOSIS — E119 Type 2 diabetes mellitus without complications: Secondary | ICD-10-CM

## 2017-10-03 LAB — COMPREHENSIVE METABOLIC PANEL
A/G RATIO: 1.6 (ref 1.2–2.2)
ALT: 32 IU/L (ref 0–32)
AST: 26 IU/L (ref 0–40)
Albumin: 4.4 g/dL (ref 3.5–5.5)
Alkaline Phosphatase: 97 IU/L (ref 39–117)
BUN / CREAT RATIO: 20 (ref 9–23)
BUN: 16 mg/dL (ref 6–24)
Bilirubin Total: 0.7 mg/dL (ref 0.0–1.2)
CO2: 25 mmol/L (ref 20–29)
CREATININE: 0.82 mg/dL (ref 0.57–1.00)
Calcium: 9.6 mg/dL (ref 8.7–10.2)
Chloride: 100 mmol/L (ref 96–106)
GFR, EST AFRICAN AMERICAN: 93 mL/min/{1.73_m2} (ref >59)
GFR, EST NON AFRICAN AMERICAN: 80 mL/min/{1.73_m2} (ref >59)
GLUCOSE: 124 mg/dL — AB (ref 65–99)
Globulin, Total: 2.7 g/dL (ref 1.5–4.5)
POTASSIUM: 4.1 mmol/L (ref 3.5–5.2)
SODIUM: 143 mmol/L (ref 134–144)
Total Protein: 7.1 g/dL (ref 6.0–8.5)

## 2017-10-03 LAB — CBC WITH DIFFERENTIAL/PLATELET
BASOS: 0 %
Basophils Absolute: 0 10*3/uL (ref 0.0–0.2)
EOS (ABSOLUTE): 0.3 10*3/uL (ref 0.0–0.4)
EOS: 3 %
HEMATOCRIT: 41.6 % (ref 34.0–46.6)
Hemoglobin: 13.6 g/dL (ref 11.1–15.9)
IMMATURE GRANS (ABS): 0 10*3/uL (ref 0.0–0.1)
IMMATURE GRANULOCYTES: 0 %
LYMPHS: 32 %
Lymphocytes Absolute: 2.7 10*3/uL (ref 0.7–3.1)
MCH: 26.7 pg (ref 26.6–33.0)
MCHC: 32.7 g/dL (ref 31.5–35.7)
MCV: 82 fL (ref 79–97)
MONOS ABS: 0.6 10*3/uL (ref 0.1–0.9)
Monocytes: 7 %
NEUTROS PCT: 58 %
Neutrophils Absolute: 4.9 10*3/uL (ref 1.4–7.0)
Platelets: 253 10*3/uL (ref 150–450)
RBC: 5.09 x10E6/uL (ref 3.77–5.28)
RDW: 15.7 % — ABNORMAL HIGH (ref 12.3–15.4)
WBC: 8.4 10*3/uL (ref 3.4–10.8)

## 2017-10-03 LAB — LIPID PANEL
CHOL/HDL RATIO: 3.5 ratio (ref 0.0–4.4)
Cholesterol, Total: 141 mg/dL (ref 100–199)
HDL: 40 mg/dL (ref >39)
LDL Calculated: 77 mg/dL (ref 0–99)
Triglycerides: 121 mg/dL (ref 0–149)
VLDL CHOLESTEROL CAL: 24 mg/dL (ref 5–40)

## 2017-10-03 LAB — TSH: TSH: 1.42 u[IU]/mL (ref 0.450–4.500)

## 2017-10-03 LAB — HEMOGLOBIN A1C
ESTIMATED AVERAGE GLUCOSE: 151 mg/dL
Hgb A1c MFr Bld: 6.9 % — ABNORMAL HIGH (ref 4.8–5.6)

## 2017-10-03 NOTE — Telephone Encounter (Signed)
-----   Message from Cheryl Yang., MD sent at 10/03/2017  8:19 AM EDT ----- Labs stable.  Patient is gone from prediabetic to diabetic.  Would start metformin 500 mg daily.  Return to clinic 3 months

## 2017-10-03 NOTE — Telephone Encounter (Signed)
LMTCB ED 

## 2017-10-03 NOTE — Telephone Encounter (Signed)
Pt returned call ° °tp °

## 2017-10-08 MED ORDER — METFORMIN HCL 500 MG PO TABS: 500.0000 mg | ORAL_TABLET | Freq: Every day | ORAL | 3 refills | Status: DC

## 2017-10-08 NOTE — Telephone Encounter (Signed)
Pt advised. She already had appt.

## 2017-10-08 NOTE — Telephone Encounter (Signed)
LMTCB ED 

## 2017-10-08 NOTE — Addendum Note (Signed)
Addended by: Ermalinda Barrios on: 10/08/2017 10:53 AM   Modules accepted: Orders

## 2017-10-09 MED ORDER — HYDROCODONE-ACETAMINOPHEN 5-325 MG PO TABS: 1.0000 | ORAL_TABLET | Freq: Four times a day (QID) | ORAL | 0 refills | Status: DC | PRN

## 2017-12-07 ENCOUNTER — Other Ambulatory Visit: Payer: Self-pay | Admitting: Family Medicine

## 2017-12-12 ENCOUNTER — Telehealth: Payer: Self-pay

## 2017-12-12 NOTE — Telephone Encounter (Signed)
LMTCB regarding form patient dropped off.   She has filled in some of the information of the provider form that has to come from [place].  We will need to have another copy of the provider part of this form without any entries.  Also we will need to measure her waist for the form.

## 2017-12-12 NOTE — Telephone Encounter (Signed)
Pt returned call. Please advise. Thanks TNP °

## 2017-12-12 NOTE — Telephone Encounter (Signed)
Advised patient we needed a blank form and then we could fill it out.

## 2017-12-24 ENCOUNTER — Telehealth: Payer: Self-pay

## 2017-12-24 NOTE — Telephone Encounter (Signed)
Patient called to follow up on the form she dropped off last week. Patient reports she need form filled out by Thursday.  CB# [phone]

## 2017-12-24 NOTE — Telephone Encounter (Signed)
Dr Darnell Level, this was in your stack yesterday to sign.  Could you check if you have signed it please.   Thanks

## 2018-01-08 ENCOUNTER — Other Ambulatory Visit: Payer: Self-pay | Admitting: Family Medicine

## 2018-01-08 DIAGNOSIS — I10 Essential (primary) hypertension: Secondary | ICD-10-CM

## 2018-01-08 DIAGNOSIS — G8929 Other chronic pain: Secondary | ICD-10-CM

## 2018-01-08 DIAGNOSIS — M549 Dorsalgia, unspecified: Principal | ICD-10-CM

## 2018-02-18 ENCOUNTER — Ambulatory Visit: Payer: BLUE CROSS/BLUE SHIELD | Admitting: Family Medicine

## 2018-02-18 VITALS — BP 138/86 | HR 76 | Temp 98.1°F | Resp 16 | Wt 242.0 lb

## 2018-02-18 DIAGNOSIS — J309 Allergic rhinitis, unspecified: Secondary | ICD-10-CM

## 2018-02-18 DIAGNOSIS — I10 Essential (primary) hypertension: Secondary | ICD-10-CM

## 2018-02-18 DIAGNOSIS — R7303 Prediabetes: Secondary | ICD-10-CM | POA: Diagnosis not present

## 2018-02-18 DIAGNOSIS — Z23 Encounter for immunization: Secondary | ICD-10-CM

## 2018-02-18 LAB — POCT GLYCOSYLATED HEMOGLOBIN (HGB A1C): HEMOGLOBIN A1C: 6.2 % — AB (ref 4.0–5.6)

## 2018-02-18 MED ORDER — MONTELUKAST SODIUM 10 MG PO TABS: 10.0000 mg | ORAL_TABLET | Freq: Every day | ORAL | 3 refills | Status: DC

## 2018-02-18 NOTE — Progress Notes (Signed)
Cheryl Yang  MRN: [id number] DOB: [birth year]  Subjective:  HPI   The patient is a 57 year old female who presents for follow up of chronic health.  She was last seen on 10/02/17 for her annual exam and had her routine labs done at that time.   Elevated Blood pressure-The patient has had elevated BP in the past.  She is currently on Benicar and has had stable readings.  BP Readings from Last 3 Encounters:  02/18/18 138/86  10/02/17 128/80  05/22/17 132/88   Hyperglycemia-The patient had her last A1C on 10/02/17 and it was 6.9.  Patient Active Problem List   Diagnosis Date Noted  . Back pain, chronic 11/06/2014  . Essential (primary) hypertension 11/06/2014  . Fibrositis 11/06/2014  . Borderline diabetes 11/06/2014  . Depression, major, recurrent, mild ([place]) 11/06/2014  . Adiposity 11/06/2014  . HLD (hyperlipidemia) 11/06/2014  . Allergic rhinitis, seasonal 11/06/2014  . Snores 11/06/2014  . Submucous leiomyoma of uterus 11/06/2014  . Lumbar spinal stenosis 07/20/2014    Past Medical History:  Diagnosis Date  . Anemia    due to fibroids (has had hysterectomy)  . Anxiety   . Arthritis   . Depression   . Eczema   . Environmental and seasonal allergies   . GERD (gastroesophageal reflux disease)    occ, uses Prilosec as needed with anti-imflammatories  . Hypertension   . PONV (postoperative nausea and vomiting)     Social History   Socioeconomic History  . Marital status: Married    Spouse name: Not on file  . Number of children: Not on file  . Years of education: Not on file  . Highest education level: Not on file  Occupational History  . Not on file  Social Needs  . Financial resource strain: Not on file  . Food insecurity:    Worry: Not on file    Inability: Not on file  . Transportation needs:    Medical: Not on file    Non-medical: Not on file  Tobacco Use  . Smoking status: Never Smoker  . Smokeless tobacco: Never Used  Substance and Sexual  Activity  . Alcohol use: Yes    Alcohol/week: 0.0 standard drinks    Comment: very rare  . Drug use: No  . Sexual activity: Yes  Lifestyle  . Physical activity:    Days per week: Not on file    Minutes per session: Not on file  . Stress: Not on file  Relationships  . Social connections:    Talks on phone: Not on file    Gets together: Not on file    Attends religious service: Not on file    Active member of club or organization: Not on file    Attends meetings of clubs or organizations: Not on file    Relationship status: Not on file  . Intimate partner violence:    Fear of current or ex partner: Not on file    Emotionally abused: Not on file    Physically abused: Not on file    Forced sexual activity: Not on file  Other Topics Concern  . Not on file  Social History Narrative  . Not on file    Outpatient Encounter Medications as of 02/18/2018  Medication Sig Note  . clotrimazole-betamethasone (LOTRISONE) cream Apply 1 application topically 2 (two) times daily.   . Coenzyme Q10 (CO Q10) 100 MG CAPS Take 200 mg by mouth daily.   . fexofenadine (ALLEGRA) 180  MG tablet Take 180 mg by mouth daily.   Marland Kitchen gabapentin (NEURONTIN) 300 MG capsule Take 2 capsules (600 mg total) by mouth 2 (two) times daily. 3 tablets daily   . glucose blood test strip Check sugar once daily DX E11.9, patient has one touch ultra mini and needs strips and lancets for this   . metFORMIN (GLUCOPHAGE) 500 MG tablet Take 1 tablet (500 mg total) by mouth daily with breakfast.   . olmesartan-hydrochlorothiazide (BENICAR HCT) 20-12.5 MG tablet TAKE 1 TABLET BY MOUTH EVERY DAY   . rosuvastatin (CRESTOR) 5 MG tablet TAKE 1 TABLET (5 MG TOTAL) BY MOUTH AT BEDTIME.   Marland Kitchen sertraline (ZOLOFT) 100 MG tablet TAKE 0.5 TABLETS (50 MG TOTAL) BY MOUTH DAILY.   Marland Kitchen triamcinolone (NASACORT ALLERGY 24HR) 55 MCG/ACT AERO nasal inhaler Place into the nose daily. 11/06/2014: Received from: [place]: Place  into the nose.  Marland Kitchen HYDROcodone-acetaminophen (NORCO/VICODIN) 5-325 MG tablet Take 1 tablet by mouth every 6 (six) hours as needed for moderate pain. (Patient not taking: Reported on 02/18/2018)   . omeprazole (PRILOSEC) 20 MG capsule Take 1 capsule (20 mg total) by mouth daily as needed (for indigestion). (Patient not taking: Reported on 10/02/2017)    No facility-administered encounter medications on file as of 02/18/2018.     Allergies  Allergen Reactions  . Salmon [Fish Allergy] Itching  . Seldane [Terfenadine] Rash  . Tagamet [Cimetidine] Itching and Rash    Review of Systems  Constitutional: Negative for fever and malaise/fatigue.  Eyes: Negative.   Respiratory: Negative for cough, shortness of breath and wheezing.   Cardiovascular: Negative for chest pain, palpitations, orthopnea, claudication and leg swelling.  Gastrointestinal: Negative.   Endo/Heme/Allergies: Negative.   Psychiatric/Behavioral: Negative.     Objective:  BP 138/86 (BP Location: Right Arm, Patient Position: Sitting, Cuff Size: Large)   Pulse 76   Temp 98.1 F (36.7 C) (Oral)   Resp 16   Wt 242 lb (109.8 kg)   SpO2 96%   BMI 40.27 kg/m   Physical Exam  Constitutional: She is oriented to person, place, and time and well-developed, well-nourished, and in no distress.  HENT:  Head: Normocephalic and atraumatic.  Right Ear: External ear normal.  Left Ear: External ear normal.  Nose: Nose normal.  Eyes: Conjunctivae are normal. No scleral icterus.  Neck: No thyromegaly present.  Cardiovascular: Normal rate, regular rhythm, normal heart sounds and intact distal pulses.  Pulmonary/Chest: Effort normal and breath sounds normal.  Abdominal: Soft.  Musculoskeletal: She exhibits no edema.  Neurological: She is alert and oriented to person, place, and time. Gait normal. GCS score is 15.  Skin: Skin is warm and dry.    Assessment and Plan :  1. Need for influenza vaccination  - Flu Vaccine QUAD 6+ mos PF IM  (Fluarix Quad PF)  2. Allergic rhinitis, unspecified seasonality, unspecified trigger  - montelukast (SINGULAIR) 10 MG tablet; Take 1 tablet (10 mg total) by mouth at bedtime.  Dispense: 30 tablet; Refill: 3  3. Essential (primary) hypertension   4. Borderline diabetes - POCT glycosylated hemoglobin (Hb A1C)6.2 today--was 6.9.  5.Obesity Doing well with weight loss.  I have done the exam and reviewed the chart and it is accurate to the best of my knowledge. Development worker, community has been used and  any errors in dictation or transcription are unintentional. Miguel Aschoff M.D. [facility]

## 2018-03-25 ENCOUNTER — Other Ambulatory Visit: Payer: Self-pay | Admitting: Obstetrics and Gynecology

## 2018-03-25 DIAGNOSIS — Z1231 Encounter for screening mammogram for malignant neoplasm of breast: Secondary | ICD-10-CM | POA: Diagnosis not present

## 2018-03-25 DIAGNOSIS — Z01419 Encounter for gynecological examination (general) (routine) without abnormal findings: Secondary | ICD-10-CM | POA: Diagnosis not present

## 2018-05-05 ENCOUNTER — Other Ambulatory Visit: Payer: Self-pay | Admitting: Family Medicine

## 2018-05-05 DIAGNOSIS — J309 Allergic rhinitis, unspecified: Secondary | ICD-10-CM

## 2018-05-13 ENCOUNTER — Ambulatory Visit
Admission: RE | Admit: 2018-05-13 | Discharge: 2018-05-13 | Disposition: A | Discharge status: Home / Self Care | Payer: BLUE CROSS/BLUE SHIELD | Source: Ambulatory Visit | Attending: Obstetrics and Gynecology | Admitting: Obstetrics and Gynecology

## 2018-05-13 DIAGNOSIS — Z1231 Encounter for screening mammogram for malignant neoplasm of breast: Secondary | ICD-10-CM | POA: Diagnosis not present

## 2018-06-09 ENCOUNTER — Other Ambulatory Visit: Payer: Self-pay | Admitting: Family Medicine

## 2018-06-16 ENCOUNTER — Other Ambulatory Visit: Payer: Self-pay | Admitting: Family Medicine

## 2018-06-16 DIAGNOSIS — I10 Essential (primary) hypertension: Secondary | ICD-10-CM

## 2018-06-23 ENCOUNTER — Telehealth: Payer: Self-pay | Admitting: Family Medicine

## 2018-06-23 ENCOUNTER — Ambulatory Visit: Payer: BLUE CROSS/BLUE SHIELD | Admitting: Family Medicine

## 2018-06-23 ENCOUNTER — Other Ambulatory Visit: Payer: Self-pay

## 2018-06-23 ENCOUNTER — Encounter: Payer: Self-pay | Admitting: Family Medicine

## 2018-06-23 VITALS — BP 136/86 | HR 96 | Temp 98.5°F | Ht 65.0 in | Wt 244.8 lb

## 2018-06-23 DIAGNOSIS — R059 Cough, unspecified: Secondary | ICD-10-CM

## 2018-06-23 DIAGNOSIS — I10 Essential (primary) hypertension: Secondary | ICD-10-CM

## 2018-06-23 DIAGNOSIS — R05 Cough: Secondary | ICD-10-CM | POA: Diagnosis not present

## 2018-06-23 DIAGNOSIS — R6889 Other general symptoms and signs: Secondary | ICD-10-CM

## 2018-06-23 DIAGNOSIS — J101 Influenza due to other identified influenza virus with other respiratory manifestations: Secondary | ICD-10-CM

## 2018-06-23 LAB — POCT INFLUENZA A/B
Influenza A, POC: POSITIVE — AB
Influenza B, POC: NEGATIVE

## 2018-06-23 MED ORDER — HYDROCOD POLST-CPM POLST ER 10-8 MG/5ML PO SUER: 5.0000 mL | Freq: Two times a day (BID) | ORAL | 0 refills | Status: DC

## 2018-06-23 MED ORDER — OSELTAMIVIR PHOSPHATE 75 MG PO CAPS: 75.0000 mg | ORAL_CAPSULE | Freq: Two times a day (BID) | ORAL | 0 refills | Status: AC

## 2018-06-23 MED ORDER — OSELTAMIVIR PHOSPHATE 75 MG PO CAPS: 75.0000 mg | ORAL_CAPSULE | Freq: Two times a day (BID) | ORAL | Status: DC

## 2018-06-23 NOTE — Progress Notes (Signed)
Patient: Cheryl Yang Female    DOB: 1960-10-10   58 y.o.   MRN: [id number] Visit Date: 06/23/2018  Today's Provider: [name], MD   Chief Complaint  Patient presents with  . Sinusitis    fever over weekend, sinus pressure, ear pain, feels it is going into chest   Subjective:     Sinusitis  This is a new problem. The current episode started in the past 7 days. The problem has been gradually worsening since onset. The maximum temperature recorded prior to her arrival was 100.4 - 100.9 F. The fever has been present for 1 to 2 days. Her pain is at a severity of 7/10. The pain is moderate. Associated symptoms include congestion, coughing, ear pain, headaches, sinus pressure, a sore throat and swollen glands. Past treatments include oral decongestants. The treatment provided moderate relief.      Allergies  Allergen Reactions  . Salmon [Fish Allergy] Itching  . Seldane [Terfenadine] Rash  . Tagamet [Cimetidine] Itching and Rash     Current Outpatient Medications:  .  clotrimazole-betamethasone (LOTRISONE) cream, Apply 1 application topically 2 (two) times daily., Disp: 30 g, Rfl: 0 .  Coenzyme Q10 (CO Q10) 100 MG CAPS, Take 200 mg by mouth daily., Disp: , Rfl:  .  fexofenadine (ALLEGRA) 180 MG tablet, Take 180 mg by mouth daily., Disp: , Rfl:  .  gabapentin (NEURONTIN) 300 MG capsule, Take 2 capsules (600 mg total) by mouth 2 (two) times daily. 3 tablets daily, Disp: 360 capsule, Rfl: 3 .  glucose blood test strip, Check sugar once daily DX E11.9, patient has one touch ultra mini and needs strips and lancets for this, Disp: 50 each, Rfl: 12 .  metFORMIN (GLUCOPHAGE) 500 MG tablet, Take 1 tablet (500 mg total) by mouth daily with breakfast., Disp: 90 tablet, Rfl: 3 .  montelukast (SINGULAIR) 10 MG tablet, TAKE 1 TABLET BY MOUTH EVERYDAY AT BEDTIME, Disp: 90 tablet, Rfl: 1 .  naproxen sodium (ALEVE) 220 MG tablet, Take 220 mg by mouth., Disp: , Rfl:  .   olmesartan-hydrochlorothiazide (BENICAR HCT) 20-12.5 MG tablet, TAKE 1 TABLET BY MOUTH EVERY DAY, Disp: 90 tablet, Rfl: 3 .  rosuvastatin (CRESTOR) 5 MG tablet, TAKE 1 TABLET (5 MG TOTAL) BY MOUTH AT BEDTIME., Disp: 90 tablet, Rfl: 3 .  sertraline (ZOLOFT) 100 MG tablet, TAKE 0.5 TABLETS (50 MG TOTAL) BY MOUTH DAILY., Disp: 45 tablet, Rfl: 12 .  triamcinolone (NASACORT ALLERGY 24HR) 55 MCG/ACT AERO nasal inhaler, Place into the nose daily., Disp: , Rfl:  .  HYDROcodone-acetaminophen (NORCO/VICODIN) 5-325 MG tablet, Take 1 tablet by mouth every 6 (six) hours as needed for moderate pain. (Patient not taking: Reported on 02/18/2018), Disp: 120 tablet, Rfl: 0 .  omeprazole (PRILOSEC) 20 MG capsule, Take 1 capsule (20 mg total) by mouth daily as needed (for indigestion). (Patient not taking: Reported on 10/02/2017), Disp: 30 capsule, Rfl: 12  Review of Systems  Constitutional: Negative.   HENT: Positive for congestion, ear pain, sinus pressure and sore throat.   Eyes: Negative.   Respiratory: Positive for cough.   Cardiovascular: Negative.   Gastrointestinal: Negative.   Endocrine: Negative.   Musculoskeletal: Negative.   Skin: Negative.   Allergic/Immunologic: Negative.   Neurological: Positive for headaches.  Hematological: Negative.   Psychiatric/Behavioral: Negative.     Social History   Tobacco Use  . Smoking status: Never Smoker  . Smokeless tobacco: Never Used  Substance Use Topics  .  Alcohol use: Yes    Alcohol/week: 0.0 standard drinks    Comment: very rare      Objective:   BP 136/86 (BP Location: Left Arm, Patient Position: Sitting, Cuff Size: Large)   Pulse 96   Temp 98.5 F (36.9 C) (Oral)   Ht 5\' 5"  (1.651 m)   Wt 244 lb 12.8 oz (111 kg)   SpO2 95%   BMI 40.74 kg/m  Vitals:   06/23/18 0923  BP: 136/86  Pulse: 96  Temp: 98.5 F (36.9 C)  TempSrc: Oral  SpO2: 95%  Weight: 244 lb 12.8 oz (111 kg)  Height: 5\' 5"  (1.651 m)     Physical Exam Constitutional:       Appearance: She is well-developed.  HENT:     Head: Normocephalic and atraumatic.  Eyes:     General: No scleral icterus.    Conjunctiva/sclera: Conjunctivae normal.  Neck:     Thyroid: No thyromegaly.  Cardiovascular:     Rate and Rhythm: Normal rate and regular rhythm.     Heart sounds: Normal heart sounds.  Pulmonary:     Effort: Pulmonary effort is normal.     Breath sounds: Normal breath sounds.  Abdominal:     Palpations: Abdomen is soft.  Skin:    General: Skin is warm and dry.  Neurological:     Mental Status: She is alert and oriented to person, place, and time.  Psychiatric:        Behavior: Behavior normal.        Thought Content: Thought content normal.        Judgment: Judgment normal.         Assessment & Plan    1. Flu-like symptoms Positive for influenza A.  B-. - POCT Influenza A/B - chlorpheniramine-HYDROcodone (TUSSIONEX PENNKINETIC ER) 10-8 MG/5ML SUER; Take 5 mLs by mouth 2 (two) times daily.  Dispense: 120 mL; Refill: 0  2. Cough Tussionex for nighttime cough.  3. Influenza A Treat with Tamiflu.  4. Essential (primary) hypertension Controlled.    I have done the exam and reviewed the above chart and it is accurate to the best of my knowledge. Development worker, community has been used in this note in any air is in the dictation or transcription are unintentional.  [name], MD  [facility]

## 2018-06-23 NOTE — Telephone Encounter (Signed)
Please advise 

## 2018-06-23 NOTE — Telephone Encounter (Signed)
Try diovan/hct--thx

## 2018-06-23 NOTE — Telephone Encounter (Signed)
Called saying the pharmacy does not have in stock the generic Benicar HCT 20-12.5 and want to know if we will send another rx in instead.  Pt's CB# [phone]  Thank s Con Memos

## 2018-06-24 MED ORDER — TELMISARTAN-HCTZ 40-12.5 MG PO TABS: 1.0000 | ORAL_TABLET | Freq: Every day | ORAL | 12 refills | Status: DC

## 2018-06-24 NOTE — Telephone Encounter (Signed)
New Rx sent to pharmacy

## 2018-06-26 ENCOUNTER — Telehealth: Payer: Self-pay

## 2018-06-26 NOTE — Telephone Encounter (Signed)
Consistent with the flu.  Happy to give her another note for today and tomorrow for work.  If she gets worse she needs to be seen again for possible secondary pneumonia or sinus infection etc.

## 2018-06-26 NOTE — Telephone Encounter (Signed)
Advised patient. Another note written.

## 2018-06-26 NOTE — Telephone Encounter (Signed)
Patient called and stated she was seen on Monday and was diagnossed and treated for the Flu.  She has been on Tamiflu since then. She was told she should be able to go back to work today.  However, she is now having chills and diarrhea.  She is still coughing and feeling bad.  She would like to know if this is part of the flu, something new going on or side effect of the meds.   She does not feel she can go to work and will need another note.   Do you need to see her or can you advise or treat without another appoinrtment? Call back number is [phone]

## 2018-06-26 NOTE — Telephone Encounter (Signed)
Please review. Thanks!  

## 2018-08-15 ENCOUNTER — Other Ambulatory Visit: Payer: Self-pay

## 2018-08-15 MED ORDER — SERTRALINE HCL 100 MG PO TABS: 50.0000 mg | ORAL_TABLET | Freq: Every day | ORAL | 12 refills | Status: DC

## 2018-08-15 NOTE — Telephone Encounter (Signed)
Pt states she takes 1/2-1 tablet a day.  She states she took her last pill today.    Thanks,   -Mickel Baas

## 2018-08-20 ENCOUNTER — Ambulatory Visit: Payer: Self-pay | Admitting: Family Medicine

## 2018-10-11 ENCOUNTER — Other Ambulatory Visit: Payer: Self-pay | Admitting: Family Medicine

## 2018-10-11 DIAGNOSIS — E119 Type 2 diabetes mellitus without complications: Secondary | ICD-10-CM

## 2018-11-03 ENCOUNTER — Other Ambulatory Visit: Payer: Self-pay | Admitting: Family Medicine

## 2018-11-03 DIAGNOSIS — J309 Allergic rhinitis, unspecified: Secondary | ICD-10-CM

## 2018-11-06 ENCOUNTER — Other Ambulatory Visit: Payer: Self-pay

## 2018-11-06 ENCOUNTER — Ambulatory Visit (INDEPENDENT_AMBULATORY_CARE_PROVIDER_SITE_OTHER): Payer: BC Managed Care – PPO | Admitting: Family Medicine

## 2018-11-06 ENCOUNTER — Encounter: Payer: Self-pay | Admitting: Family Medicine

## 2018-11-06 VITALS — BP 126/70 | HR 76 | Temp 99.0°F | Resp 16 | Ht 65.0 in | Wt 241.0 lb

## 2018-11-06 DIAGNOSIS — M5442 Lumbago with sciatica, left side: Secondary | ICD-10-CM

## 2018-11-06 DIAGNOSIS — M2041 Other hammer toe(s) (acquired), right foot: Secondary | ICD-10-CM

## 2018-11-06 DIAGNOSIS — R7303 Prediabetes: Secondary | ICD-10-CM

## 2018-11-06 DIAGNOSIS — M48061 Spinal stenosis, lumbar region without neurogenic claudication: Secondary | ICD-10-CM

## 2018-11-06 DIAGNOSIS — G8929 Other chronic pain: Secondary | ICD-10-CM

## 2018-11-06 DIAGNOSIS — Z6841 Body Mass Index (BMI) 40.0 and over, adult: Secondary | ICD-10-CM

## 2018-11-06 DIAGNOSIS — M5441 Lumbago with sciatica, right side: Secondary | ICD-10-CM

## 2018-11-06 DIAGNOSIS — I10 Essential (primary) hypertension: Secondary | ICD-10-CM | POA: Diagnosis not present

## 2018-11-06 LAB — POCT GLYCOSYLATED HEMOGLOBIN (HGB A1C)
Est. average glucose Bld gHb Est-mCnc: 137
Hemoglobin A1C: 6.4 % — AB (ref 4.0–5.6)

## 2018-11-06 MED ORDER — SERTRALINE HCL 50 MG PO TABS: 50.0000 mg | ORAL_TABLET | Freq: Every day | ORAL | 3 refills | Status: DC

## 2018-11-06 MED ORDER — SERTRALINE HCL 100 MG PO TABS: 100.0000 mg | ORAL_TABLET | Freq: Every day | ORAL | 3 refills | Status: DC

## 2018-11-06 NOTE — Progress Notes (Signed)
Patient: Cheryl Yang Female    DOB: Jan 31, 1961   58 y.o.   MRN: [id number] Visit Date: 11/06/2018  Today's Provider: [name], MD   Chief Complaint  Patient presents with  . Hyperglycemia  . Hypertension   Subjective:     HPI  Prediabetes, Follow-up:   Lab Results  Component Value Date   HGBA1C 6.4 (A) 11/06/2018   HGBA1C 6.2 (A) 02/18/2018   HGBA1C 6.9 (H) 10/02/2017   GLUCOSE 124 (H) 10/02/2017   GLUCOSE 106 (H) 10/17/2016   GLUCOSE 117 (H) 10/12/2015    Last seen for for this6 months ago.  Management since that visit includes none. Current symptoms include none and have been stable.  Weight trend: stable Prior visit with dietician: no Current diet: well balanced Current exercise: walking   Hypertension, follow-up:  BP Readings from Last 3 Encounters:  11/06/18 126/70  06/23/18 136/86  02/18/18 138/86    She was last seen for hypertension 6 months ago.  BP at that visit was 138/86. Management changes since that visit include no changes. She reports good compliance with treatment. She is not having side effects.  She is exercising. She is adherent to low salt diet.   Outside blood pressures are checked occasionally. She is experiencing none.  Patient denies exertional chest pressure/discomfort, lower extremity edema and palpitations.     Pertinent Labs:    Component Value Date/Time   CHOL 141 10/02/2017 0940   TRIG 121 10/02/2017 0940   CHOLHDL 3.5 10/02/2017 0940   CREATININE 0.82 10/02/2017 0940   CREATININE 0.97 05/28/2014 1325    Wt Readings from Last 3 Encounters:  11/06/18 241 lb (109.3 kg)  06/23/18 244 lb 12.8 oz (111 kg)  02/18/18 242 lb (109.8 kg)    Allergies  Allergen Reactions  . Salmon [Fish Allergy] Itching  . Seldane [Terfenadine] Rash  . Tagamet [Cimetidine] Itching and Rash     Current Outpatient Medications:  .  clotrimazole-betamethasone (LOTRISONE) cream, Apply 1 application topically 2 (two)  times daily., Disp: 30 g, Rfl: 0 .  Coenzyme Q10 (CO Q10) 100 MG CAPS, Take 200 mg by mouth daily., Disp: , Rfl:  .  fexofenadine (ALLEGRA) 180 MG tablet, Take 180 mg by mouth daily., Disp: , Rfl:  .  gabapentin (NEURONTIN) 300 MG capsule, Take 2 capsules (600 mg total) by mouth 2 (two) times daily. 3 tablets daily, Disp: 360 capsule, Rfl: 3 .  glucose blood test strip, Check sugar once daily DX E11.9, patient has one touch ultra mini and needs strips and lancets for this, Disp: 50 each, Rfl: 12 .  metFORMIN (GLUCOPHAGE) 500 MG tablet, TAKE 1 TABLET BY MOUTH EVERY DAY WITH BREAKFAST, Disp: 90 tablet, Rfl: 3 .  montelukast (SINGULAIR) 10 MG tablet, TAKE 1 TABLET BY MOUTH EVERYDAY AT BEDTIME, Disp: 90 tablet, Rfl: 1 .  naproxen sodium (ALEVE) 220 MG tablet, Take 220 mg by mouth., Disp: , Rfl:  .  rosuvastatin (CRESTOR) 5 MG tablet, TAKE 1 TABLET (5 MG TOTAL) BY MOUTH AT BEDTIME., Disp: 90 tablet, Rfl: 3 .  sertraline (ZOLOFT) 100 MG tablet, Take 0.5 tablets (50 mg total) by mouth daily., Disp: 45 tablet, Rfl: 12 .  telmisartan-hydrochlorothiazide (MICARDIS HCT) 40-12.5 MG tablet, Take 1 tablet by mouth daily., Disp: 30 tablet, Rfl: 12 .  triamcinolone (NASACORT ALLERGY 24HR) 55 MCG/ACT AERO nasal inhaler, Place into the nose daily., Disp: , Rfl:  .  chlorpheniramine-HYDROcodone (TUSSIONEX PENNKINETIC ER) 10-8 MG/5ML  SUER, Take 5 mLs by mouth 2 (two) times daily., Disp: 120 mL, Rfl: 0 .  HYDROcodone-acetaminophen (NORCO/VICODIN) 5-325 MG tablet, Take 1 tablet by mouth every 6 (six) hours as needed for moderate pain. (Patient not taking: Reported on 02/18/2018), Disp: 120 tablet, Rfl: 0 .  omeprazole (PRILOSEC) 20 MG capsule, Take 1 capsule (20 mg total) by mouth daily as needed (for indigestion). (Patient not taking: Reported on 10/02/2017), Disp: 30 capsule, Rfl: 12  Review of Systems  Constitutional: Negative for activity change, appetite change, chills, diaphoresis, fatigue and fever.  Respiratory:  Negative for cough and shortness of breath.   Cardiovascular: Negative for chest pain, palpitations and leg swelling.  Endocrine: Negative for cold intolerance, heat intolerance, polydipsia, polyphagia and polyuria.  Musculoskeletal: Negative for arthralgias and joint swelling.  Neurological: Negative for dizziness, light-headedness and headaches.  Psychiatric/Behavioral: Negative for agitation, self-injury, sleep disturbance and suicidal ideas. The patient is not nervous/anxious.     Social History   Tobacco Use  . Smoking status: Never Smoker  . Smokeless tobacco: Never Used  Substance Use Topics  . Alcohol use: Yes    Alcohol/week: 0.0 standard drinks    Comment: very rare      Objective:   BP 126/70 (BP Location: Right Arm, Patient Position: Sitting, Cuff Size: Large)   Pulse 76   Temp 99 F (37.2 C)   Resp 16   Ht 5\' 5"  (1.651 m)   Wt 241 lb (109.3 kg)   SpO2 96%   BMI 40.10 kg/m  Vitals:   11/06/18 0842  BP: 126/70  Pulse: 76  Resp: 16  Temp: 99 F (37.2 C)  SpO2: 96%  Weight: 241 lb (109.3 kg)  Height: 5\' 5"  (1.651 m)     Physical Exam Vitals signs reviewed.  Constitutional:      Appearance: She is well-developed.  HENT:     Head: Normocephalic and atraumatic.  Eyes:     General: No scleral icterus.    Conjunctiva/sclera: Conjunctivae normal.  Neck:     Thyroid: No thyromegaly.  Cardiovascular:     Rate and Rhythm: Normal rate and regular rhythm.     Heart sounds: Normal heart sounds.  Pulmonary:     Effort: Pulmonary effort is normal.     Breath sounds: Normal breath sounds.  Abdominal:     Palpations: Abdomen is soft.  Musculoskeletal:     Comments: sligh hammertoe of right 2nd toe.  Skin:    General: Skin is warm and dry.  Neurological:     Mental Status: She is alert and oriented to person, place, and time.     Comments: Monofilament exam normal.  Psychiatric:        Mood and Affect: Mood normal.        Behavior: Behavior normal.         Thought Content: Thought content normal.        Judgment: Judgment normal.      Results for orders placed or performed in visit on 11/06/18  POCT glycosylated hemoglobin (Hb A1C)  Result Value Ref Range   Hemoglobin A1C 6.4 (A) 4.0 - 5.6 %   HbA1c POC (<> result, manual entry)     HbA1c, POC (prediabetic range)     HbA1c, POC (controlled diabetic range)     Est. average glucose Bld gHb Est-mCnc 137        Assessment & Plan    1. Borderline diabetes  - POCT glycosylated hemoglobin (Hb A1C)--6.4 today  2. Essential (primary) hypertension   3. Spinal stenosis of lumbar region, unspecified whether neurogenic claudication present   4. Chronic bilateral low back pain with bilateral sciatica   5. Class 3 severe obesity due to excess calories with serious comorbidity and body mass index (BMI) of 40.0 to 44.9 in adult [facility]) Lifestyle stressed. 6.Hammertoe of right 2nd toe    [name], MD  [facility],Rachelle L Pressley,acting as a scribe for [name], MD.,have documented all relevant documentation on the behalf of [name], MD,as directed by  [name], MD while in the presence of [name], MD.

## 2018-12-08 ENCOUNTER — Other Ambulatory Visit: Payer: Self-pay | Admitting: Family Medicine

## 2019-03-09 NOTE — Progress Notes (Signed)
Patient: Cheryl Yang Female    DOB: 05-16-1960   58 y.o.   MRN: [id number] Visit Date: 03/11/2019  Today's Provider: [name], MD   Chief Complaint  Patient presents with  . Follow-up  . Hypertension   Subjective:     HPI   Borderline diabetes From 11/06/2018-POCT glycosylated hemoglobin (Hb A1C)--6.4 today  Fasting blood sugars 90-135.  Essential (primary) hypertension From 11/06/2018  Spinal stenosis of lumbar region, unspecified whether neurogenic claudication present From 11/06/2018  Chronic bilateral low back pain with bilateral sciatica From 11/06/2018  Class 3 severe obesity due to excess calories with serious comorbidity and body mass index (BMI) of 40.0 to 44.9 in adult [facility]) From 11/06/2018-Lifestyle stressed.  Hammertoe of right 2nd toe From 11/06/2018  Allergies  Allergen Reactions  . Salmon [Fish Allergy] Itching  . Seldane [Terfenadine] Rash  . Tagamet [Cimetidine] Itching and Rash     Current Outpatient Medications:  .  clotrimazole-betamethasone (LOTRISONE) cream, Apply 1 application topically 2 (two) times daily., Disp: 30 g, Rfl: 0 .  Coenzyme Q10 (CO Q10) 100 MG CAPS, Take 200 mg by mouth daily., Disp: , Rfl:  .  fexofenadine (ALLEGRA) 180 MG tablet, Take 180 mg by mouth daily., Disp: , Rfl:  .  gabapentin (NEURONTIN) 300 MG capsule, Take 2 capsules (600 mg total) by mouth 2 (two) times daily. 3 tablets daily, Disp: 360 capsule, Rfl: 3 .  glucose blood test strip, Check sugar once daily DX E11.9, patient has one touch ultra mini and needs strips and lancets for this, Disp: 50 each, Rfl: 12 .  metFORMIN (GLUCOPHAGE) 500 MG tablet, TAKE 1 TABLET BY MOUTH EVERY DAY WITH BREAKFAST, Disp: 90 tablet, Rfl: 3 .  montelukast (SINGULAIR) 10 MG tablet, TAKE 1 TABLET BY MOUTH EVERYDAY AT BEDTIME, Disp: 90 tablet, Rfl: 1 .  naproxen sodium (ALEVE) 220 MG tablet, Take 220 mg by mouth., Disp: , Rfl:  .  rosuvastatin (CRESTOR) 5 MG tablet,  TAKE 1 TABLET (5 MG TOTAL) BY MOUTH AT BEDTIME., Disp: 90 tablet, Rfl: 3 .  sertraline (ZOLOFT) 100 MG tablet, Take 1 tablet (100 mg total) by mouth daily., Disp: 90 tablet, Rfl: 3 .  telmisartan-hydrochlorothiazide (MICARDIS HCT) 40-12.5 MG tablet, Take 1 tablet by mouth daily., Disp: 30 tablet, Rfl: 12 .  triamcinolone (NASACORT ALLERGY 24HR) 55 MCG/ACT AERO nasal inhaler, Place into the nose daily., Disp: , Rfl:  .  chlorpheniramine-HYDROcodone (TUSSIONEX PENNKINETIC ER) 10-8 MG/5ML SUER, Take 5 mLs by mouth 2 (two) times daily. (Patient not taking: Reported on 03/11/2019), Disp: 120 mL, Rfl: 0 .  HYDROcodone-acetaminophen (NORCO/VICODIN) 5-325 MG tablet, Take 1 tablet by mouth every 6 (six) hours as needed for moderate pain. (Patient not taking: Reported on 02/18/2018), Disp: 120 tablet, Rfl: 0 .  omeprazole (PRILOSEC) 20 MG capsule, Take 1 capsule (20 mg total) by mouth daily as needed (for indigestion). (Patient not taking: Reported on 10/02/2017), Disp: 30 capsule, Rfl: 12  Review of Systems  Constitutional: Negative for appetite change, chills, fatigue and fever.  HENT: Negative.   Respiratory: Negative for chest tightness and shortness of breath.   Cardiovascular: Negative for chest pain and palpitations.  Gastrointestinal: Negative for abdominal pain, nausea and vomiting.  Musculoskeletal: Positive for back pain.  Allergic/Immunologic: Negative.   Neurological: Negative for dizziness and weakness.  Hematological: Negative.   Psychiatric/Behavioral: Negative.     Social History   Tobacco Use  . Smoking status: Never Smoker  . Smokeless  tobacco: Never Used  Substance Use Topics  . Alcohol use: Yes    Alcohol/week: 0.0 standard drinks    Comment: very rare      Objective:   BP 121/71 (BP Location: Left Arm, Patient Position: Sitting, Cuff Size: Large)   Pulse 73   Temp 97.7 F (36.5 C) (Other (Comment))   Resp 16   Ht 5\' 5"  (1.651 m)   Wt 247 lb (112 kg)   SpO2 95%    BMI 41.10 kg/m  Vitals:   03/11/19 0821  BP: 121/71  Pulse: 73  Resp: 16  Temp: 97.7 F (36.5 C)  TempSrc: Other (Comment)  SpO2: 95%  Weight: 247 lb (112 kg)  Height: 5\' 5"  (1.651 m)  Body mass index is 41.1 kg/m.   Physical Exam Vitals signs reviewed.  Constitutional:      Appearance: She is well-developed.  HENT:     Head: Normocephalic and atraumatic.  Eyes:     General: No scleral icterus.    Conjunctiva/sclera: Conjunctivae normal.  Neck:     Thyroid: No thyromegaly.  Cardiovascular:     Rate and Rhythm: Normal rate and regular rhythm.     Heart sounds: Normal heart sounds.  Pulmonary:     Effort: Pulmonary effort is normal.     Breath sounds: Normal breath sounds.  Abdominal:     Palpations: Abdomen is soft.  Musculoskeletal:     Comments: sligh hammertoe of right 2nd toe.  Skin:    General: Skin is warm and dry.  Neurological:     Mental Status: She is alert and oriented to person, place, and time.     Comments: Monofilament exam normal.  Psychiatric:        Mood and Affect: Mood normal.        Behavior: Behavior normal.        Thought Content: Thought content normal.        Judgment: Judgment normal.      Results for orders placed or performed in visit on 03/11/19  POCT glycosylated hemoglobin (Hb A1C)  Result Value Ref Range   Hemoglobin A1C 6.3 (A) 4.0 - 5.6 %   Est. average glucose Bld gHb Est-mCnc 134        Assessment & Plan    1. Borderline diabetes A1C is 6.3 today. Urine microalbumin on next visit.  2. Essential (primary) hypertension  - CBC w/Diff/Platelet - Comprehensive Metabolic Panel (CMET) - TSH - Lipid panel  3. Other hyperlipidemia  - CBC w/Diff/Platelet - Comprehensive Metabolic Panel (CMET) - TSH - Lipid panel  4. Diabetes mellitus without complication (HCC)  - poct Hemoglobin A1c 6.3 - CBC w/Diff/Platelet - Comprehensive Metabolic Panel (CMET) - TSH - Lipid panel  5. Allergic rhinitis, unspecified  seasonality, unspecified trigger  - CBC w/Diff/Platelet - Comprehensive Metabolic Panel (CMET) - TSH - Lipid panel  Follow up in 4 months.      Cranford Mon, MD  [facility]

## 2019-03-11 ENCOUNTER — Encounter: Payer: Self-pay | Admitting: Family Medicine

## 2019-03-11 ENCOUNTER — Ambulatory Visit: Payer: BC Managed Care – PPO | Admitting: Family Medicine

## 2019-03-11 ENCOUNTER — Other Ambulatory Visit: Payer: Self-pay

## 2019-03-11 VITALS — BP 121/71 | HR 73 | Temp 97.7°F | Resp 16 | Ht 65.0 in | Wt 247.0 lb

## 2019-03-11 DIAGNOSIS — I10 Essential (primary) hypertension: Secondary | ICD-10-CM

## 2019-03-11 DIAGNOSIS — E7849 Other hyperlipidemia: Secondary | ICD-10-CM

## 2019-03-11 DIAGNOSIS — R7303 Prediabetes: Secondary | ICD-10-CM

## 2019-03-11 DIAGNOSIS — E119 Type 2 diabetes mellitus without complications: Secondary | ICD-10-CM | POA: Diagnosis not present

## 2019-03-11 DIAGNOSIS — J309 Allergic rhinitis, unspecified: Secondary | ICD-10-CM

## 2019-03-11 LAB — POCT GLYCOSYLATED HEMOGLOBIN (HGB A1C)
Est. average glucose Bld gHb Est-mCnc: 134
Hemoglobin A1C: 6.3 % — AB (ref 4.0–5.6)

## 2019-03-12 LAB — COMPREHENSIVE METABOLIC PANEL
ALT: 26 IU/L (ref 0–32)
AST: 22 IU/L (ref 0–40)
Albumin/Globulin Ratio: 1.8 (ref 1.2–2.2)
Albumin: 4.6 g/dL (ref 3.8–4.9)
Alkaline Phosphatase: 94 IU/L (ref 39–117)
BUN/Creatinine Ratio: 22 (ref 9–23)
BUN: 17 mg/dL (ref 6–24)
Bilirubin Total: 0.6 mg/dL (ref 0.0–1.2)
CO2: 23 mmol/L (ref 20–29)
Calcium: 9.9 mg/dL (ref 8.7–10.2)
Chloride: 102 mmol/L (ref 96–106)
Creatinine, Ser: 0.79 mg/dL (ref 0.57–1.00)
GFR calc Af Amer: 96 mL/min/{1.73_m2} (ref >59)
GFR calc non Af Amer: 83 mL/min/{1.73_m2} (ref >59)
Globulin, Total: 2.5 g/dL (ref 1.5–4.5)
Glucose: 117 mg/dL — ABNORMAL HIGH (ref 65–99)
Potassium: 4.2 mmol/L (ref 3.5–5.2)
Sodium: 144 mmol/L (ref 134–144)
Total Protein: 7.1 g/dL (ref 6.0–8.5)

## 2019-03-12 LAB — CBC WITH DIFFERENTIAL/PLATELET
Basophils Absolute: 0 10*3/uL (ref 0.0–0.2)
Basos: 0 %
EOS (ABSOLUTE): 0.2 10*3/uL (ref 0.0–0.4)
Eos: 3 %
Hematocrit: 38.6 % (ref 34.0–46.6)
Hemoglobin: 13.1 g/dL (ref 11.1–15.9)
Immature Grans (Abs): 0 10*3/uL (ref 0.0–0.1)
Immature Granulocytes: 0 %
Lymphocytes Absolute: 2.2 10*3/uL (ref 0.7–3.1)
Lymphs: 28 %
MCH: 27.7 pg (ref 26.6–33.0)
MCHC: 33.9 g/dL (ref 31.5–35.7)
MCV: 82 fL (ref 79–97)
Monocytes Absolute: 0.6 10*3/uL (ref 0.1–0.9)
Monocytes: 8 %
Neutrophils Absolute: 4.7 10*3/uL (ref 1.4–7.0)
Neutrophils: 61 %
Platelets: 272 10*3/uL (ref 150–450)
RBC: 4.73 x10E6/uL (ref 3.77–5.28)
RDW: 13 % (ref 11.7–15.4)
WBC: 7.7 10*3/uL (ref 3.4–10.8)

## 2019-03-12 LAB — LIPID PANEL
Chol/HDL Ratio: 3.4 ratio (ref 0.0–4.4)
Cholesterol, Total: 137 mg/dL (ref 100–199)
HDL: 40 mg/dL (ref >39)
LDL Chol Calc (NIH): 76 mg/dL (ref 0–99)
Triglycerides: 115 mg/dL (ref 0–149)
VLDL Cholesterol Cal: 21 mg/dL (ref 5–40)

## 2019-03-12 LAB — TSH: TSH: 1.76 u[IU]/mL (ref 0.450–4.500)

## 2019-03-16 ENCOUNTER — Telehealth: Payer: Self-pay

## 2019-03-16 NOTE — Telephone Encounter (Signed)
Patient advised as directed below. 

## 2019-03-16 NOTE — Telephone Encounter (Signed)
-----   Message from Jerrol Banana., MD sent at 03/13/2019 11:27 AM EDT ----- Labs stable.

## 2019-04-01 ENCOUNTER — Other Ambulatory Visit: Payer: Self-pay | Admitting: Obstetrics and Gynecology

## 2019-04-01 DIAGNOSIS — Z1231 Encounter for screening mammogram for malignant neoplasm of breast: Secondary | ICD-10-CM | POA: Diagnosis not present

## 2019-04-01 DIAGNOSIS — L309 Dermatitis, unspecified: Secondary | ICD-10-CM | POA: Diagnosis not present

## 2019-04-01 DIAGNOSIS — R232 Flushing: Secondary | ICD-10-CM | POA: Diagnosis not present

## 2019-04-01 DIAGNOSIS — Z01419 Encounter for gynecological examination (general) (routine) without abnormal findings: Secondary | ICD-10-CM | POA: Diagnosis not present

## 2019-04-30 ENCOUNTER — Other Ambulatory Visit: Payer: Self-pay | Admitting: Family Medicine

## 2019-04-30 DIAGNOSIS — J309 Allergic rhinitis, unspecified: Secondary | ICD-10-CM

## 2019-05-14 ENCOUNTER — Other Ambulatory Visit: Payer: Self-pay | Admitting: Family Medicine

## 2019-05-14 DIAGNOSIS — M549 Dorsalgia, unspecified: Secondary | ICD-10-CM

## 2019-05-14 DIAGNOSIS — G8929 Other chronic pain: Secondary | ICD-10-CM

## 2019-05-28 ENCOUNTER — Telehealth: Payer: Self-pay | Admitting: Family Medicine

## 2019-05-28 DIAGNOSIS — G8929 Other chronic pain: Secondary | ICD-10-CM

## 2019-05-28 DIAGNOSIS — M549 Dorsalgia, unspecified: Secondary | ICD-10-CM

## 2019-05-28 NOTE — Telephone Encounter (Signed)
RX REFILL gabapentin (NEURONTIN) 300 MG capsule  PHARMACY [id number] [place], [place] Phone:  [phone]  Fax:  [phone]

## 2019-05-29 MED ORDER — GABAPENTIN 300 MG PO CAPS: 600.0000 mg | ORAL_CAPSULE | Freq: Two times a day (BID) | ORAL | 3 refills | Status: DC

## 2019-06-02 ENCOUNTER — Other Ambulatory Visit: Payer: Self-pay | Admitting: Family Medicine

## 2019-06-02 ENCOUNTER — Ambulatory Visit
Admission: RE | Admit: 2019-06-02 | Discharge: 2019-06-02 | Disposition: A | Discharge status: Home / Self Care | Payer: BC Managed Care – PPO | Source: Ambulatory Visit | Attending: Obstetrics and Gynecology | Admitting: Obstetrics and Gynecology

## 2019-06-02 DIAGNOSIS — Z1231 Encounter for screening mammogram for malignant neoplasm of breast: Secondary | ICD-10-CM

## 2019-06-02 DIAGNOSIS — G8929 Other chronic pain: Secondary | ICD-10-CM

## 2019-06-02 DIAGNOSIS — M549 Dorsalgia, unspecified: Secondary | ICD-10-CM

## 2019-06-08 NOTE — Telephone Encounter (Signed)
Pt said the pharm does not have her refill. Please resend

## 2019-06-09 MED ORDER — GABAPENTIN 300 MG PO CAPS: 600.0000 mg | ORAL_CAPSULE | Freq: Two times a day (BID) | ORAL | 3 refills | Status: DC

## 2019-06-09 NOTE — Addendum Note (Signed)
Addended by: Althea Charon D on: 06/09/2019 02:42 PM   Modules accepted: Orders

## 2019-06-12 ENCOUNTER — Other Ambulatory Visit: Payer: Self-pay | Admitting: Family Medicine

## 2019-06-12 NOTE — Telephone Encounter (Signed)
Patient has appointment 07/20/19. Refilled per protocol.

## 2019-07-17 NOTE — Progress Notes (Signed)
Patient: Cheryl Yang, Female    DOB: 1960/12/12, 59 y.o.   MRN: [id number] Visit Date: 07/20/2019  Today's Provider: [name], MD   Chief Complaint  Patient presents with  . Annual Exam   Subjective:     Annual physical exam Cheryl Yang is a 59 y.o. female who presents today for health maintenance and complete physical. She feels fairly well. She reports exercising none. She reports she is sleeping fairly well. Overall she is doing well.  She had her first Covid shot yesterday.  Her only issue is 1 of chronic low back pain with sciatica.  She sees Dr. Leafy Ro from [place]  [facility] for her well woman exam.  -----------------------------------------------------------  Colonoscopy: 07/08/2015 Mammogram: 06/02/2019  Review of Systems  Constitutional: Positive for activity change.  HENT: Negative.   Eyes: Positive for discharge and itching.  Respiratory: Negative.   Cardiovascular: Negative.   Gastrointestinal: Negative.   Musculoskeletal: Positive for back pain.  Allergic/Immunologic: Positive for environmental allergies.  Psychiatric/Behavioral: Negative.     Social History      She  reports that she has never smoked. She has never used smokeless tobacco. She reports current alcohol use. She reports that she does not use drugs.       Social History   Socioeconomic History  . Marital status: Married    Spouse name: Not on file  . Number of children: Not on file  . Years of education: Not on file  . Highest education level: Not on file  Occupational History  . Not on file  Tobacco Use  . Smoking status: Never Smoker  . Smokeless tobacco: Never Used  Substance and Sexual Activity  . Alcohol use: Yes    Alcohol/week: 0.0 standard drinks    Comment: very rare  . Drug use: No  . Sexual activity: Yes  Other Topics Concern  . Not on file  Social History Narrative  . Not on file   Social Determinants of Health   Financial Resource Strain:   .  Difficulty of Paying Living Expenses: Not on file  Food Insecurity:   . Worried About Charity fundraiser in the Last Year: Not on file  . Ran Out of Food in the Last Year: Not on file  Transportation Needs:   . Lack of Transportation (Medical): Not on file  . Lack of Transportation (Non-Medical): Not on file  Physical Activity:   . Days of Exercise per Week: Not on file  . Minutes of Exercise per Session: Not on file  Stress:   . Feeling of Stress : Not on file  Social Connections:   . Frequency of Communication with Friends and Family: Not on file  . Frequency of Social Gatherings with Friends and Family: Not on file  . Attends Religious Services: Not on file  . Active Member of Clubs or Organizations: Not on file  . Attends Archivist Meetings: Not on file  . Marital Status: Not on file    Past Medical History:  Diagnosis Date  . Anemia    due to fibroids (has had hysterectomy)  . Anxiety   . Arthritis   . Depression   . Eczema   . Environmental and seasonal allergies   . GERD (gastroesophageal reflux disease)    occ, uses Prilosec as needed with anti-imflammatories  . Hypertension   . PONV (postoperative nausea and vomiting)      Patient Active Problem List  Diagnosis Date Noted  . Back pain, chronic 11/06/2014  . Essential (primary) hypertension 11/06/2014  . Fibrositis 11/06/2014  . Borderline diabetes 11/06/2014  . Depression, major, recurrent, mild ([place]) 11/06/2014  . Adiposity 11/06/2014  . HLD (hyperlipidemia) 11/06/2014  . Allergic rhinitis, seasonal 11/06/2014  . Snores 11/06/2014  . Submucous leiomyoma of uterus 11/06/2014  . Lumbar spinal stenosis 07/20/2014    Past Surgical History:  Procedure Laterality Date  . BACK SURGERY     lumbar surgery  . COLONOSCOPY    . EYE SURGERY Left    had hole in eye repaired  . VAGINAL HYSTERECTOMY      Family History        Family Status  Relation Name Status  . Mother  Alive  . Father   Deceased       due to MVA  . Brother  Alive  . Daughter  Alive  . Son  Alive  . Brother  Alive  . Neg Hx  (Not Specified)        Her family history includes CVA in her mother; Heart attack in her mother; Restless legs syndrome in her mother. There is no history of Breast cancer.      Allergies  Allergen Reactions  . Salmon [Fish Allergy] Itching  . Seldane [Terfenadine] Rash  . Tagamet [Cimetidine] Itching and Rash     Current Outpatient Medications:  .  clotrimazole-betamethasone (LOTRISONE) cream, Apply 1 application topically 2 (two) times daily., Disp: 30 g, Rfl: 0 .  Coenzyme Q10 (CO Q10) 100 MG CAPS, Take 200 mg by mouth daily., Disp: , Rfl:  .  fexofenadine (ALLEGRA) 180 MG tablet, Take 180 mg by mouth daily., Disp: , Rfl:  .  gabapentin (NEURONTIN) 300 MG capsule, Take 2 capsules (600 mg total) by mouth 2 (two) times daily. 3 tablets daily, Disp: 360 capsule, Rfl: 3 .  glucose blood test strip, Check sugar once daily DX E11.9, patient has one touch ultra mini and needs strips and lancets for this, Disp: 50 each, Rfl: 12 .  metFORMIN (GLUCOPHAGE) 500 MG tablet, TAKE 1 TABLET BY MOUTH EVERY DAY WITH BREAKFAST, Disp: 90 tablet, Rfl: 3 .  montelukast (SINGULAIR) 10 MG tablet, TAKE 1 TABLET BY MOUTH EVERYDAY AT BEDTIME, Disp: 90 tablet, Rfl: 1 .  naproxen sodium (ALEVE) 220 MG tablet, Take 220 mg by mouth., Disp: , Rfl:  .  Omega-3 Fatty Acids (OMEGA 3 PO), Take by mouth., Disp: , Rfl:  .  rosuvastatin (CRESTOR) 5 MG tablet, TAKE 1 TABLET (5 MG TOTAL) BY MOUTH AT BEDTIME., Disp: 90 tablet, Rfl: 3 .  sertraline (ZOLOFT) 100 MG tablet, Take 1 tablet (100 mg total) by mouth daily., Disp: 90 tablet, Rfl: 3 .  telmisartan-hydrochlorothiazide (MICARDIS HCT) 40-12.5 MG tablet, TAKE 1 TABLET BY MOUTH EVERY DAY, Disp: 90 tablet, Rfl: 0 .  triamcinolone (NASACORT ALLERGY 24HR) 55 MCG/ACT AERO nasal inhaler, Place into the nose daily., Disp: , Rfl:  .  VITAMIN D PO, Take 1,000 Units by mouth  daily at 12 noon., Disp: , Rfl:  .  chlorpheniramine-HYDROcodone (TUSSIONEX PENNKINETIC ER) 10-8 MG/5ML SUER, Take 5 mLs by mouth 2 (two) times daily. (Patient not taking: Reported on 07/20/2019), Disp: 120 mL, Rfl: 0 .  HYDROcodone-acetaminophen (NORCO/VICODIN) 5-325 MG tablet, Take 1 tablet by mouth every 6 (six) hours as needed for moderate pain. (Patient not taking: Reported on 02/18/2018), Disp: 120 tablet, Rfl: 0 .  omeprazole (PRILOSEC) 20 MG capsule, Take 1 capsule (20 mg  total) by mouth daily as needed (for indigestion). (Patient not taking: Reported on 10/02/2017), Disp: 30 capsule, Rfl: 12   Patient Care Team: Jerrol Banana., MD as PCP - General (Family Medicine)    Objective:    Vitals: BP 134/88 (BP Location: Left Arm, Patient Position: Sitting, Cuff Size: Large)   Pulse 85   Temp (!) 97.3 F (36.3 C) (Other (Comment))   Resp 16   Ht 5\' 5"  (1.651 m)   Wt 245 lb (111.1 kg)   SpO2 94%   BMI 40.77 kg/m    Vitals:   07/20/19 0932  BP: 134/88  Pulse: 85  Resp: 16  Temp: (!) 97.3 F (36.3 C)  TempSrc: Other (Comment)  SpO2: 94%  Weight: 245 lb (111.1 kg)  Height: 5\' 5"  (1.651 m)     Physical Exam Vitals reviewed.  Constitutional:      Appearance: She is well-developed. She is obese.  HENT:     Head: Normocephalic and atraumatic.     Right Ear: Tympanic membrane, ear canal and external ear normal.     Left Ear: Tympanic membrane, ear canal and external ear normal.     Mouth/Throat:     Pharynx: Oropharynx is clear.  Eyes:     General: No scleral icterus.    Extraocular Movements: Extraocular movements intact.     Conjunctiva/sclera: Conjunctivae normal.     Pupils: Pupils are equal, round, and reactive to light.  Neck:     Thyroid: No thyromegaly.     Vascular: No carotid bruit.  Cardiovascular:     Rate and Rhythm: Normal rate and regular rhythm.     Heart sounds: Normal heart sounds.  Pulmonary:     Effort: Pulmonary effort is normal.     Breath  sounds: Normal breath sounds.  Abdominal:     Palpations: Abdomen is soft.  Lymphadenopathy:     Cervical: No cervical adenopathy.  Skin:    General: Skin is warm and dry.  Neurological:     General: No focal deficit present.     Mental Status: She is alert and oriented to person, place, and time.     Motor: No weakness.  Psychiatric:        Mood and Affect: Mood normal.        Behavior: Behavior normal.        Thought Content: Thought content normal.        Judgment: Judgment normal.      Depression Screen PHQ 2/9 Scores 07/20/2019 06/23/2018 10/02/2017 10/03/2016  PHQ - 2 Score 0 0 0 0  PHQ- 9 Score 0 - 3 0       Assessment & Plan:     Routine Health Maintenance and Physical Exam  Exercise Activities and Dietary recommendations Goals   None     Immunization History  Administered Date(s) Administered  . Influenza Inj Mdck Quad Pf 02/28/2017  . Influenza,inj,Quad PF,6+ Mos 02/18/2018, 03/02/2019  . Influenza-Unspecified 03/20/2015  . PFIZER SARS-COV-2 Vaccination 07/19/2019, 07/19/2019  . Td 04/29/2003  . Tdap 11/23/2013    Health Maintenance  Topic Date Due  . PNEUMOCOCCAL POLYSACCHARIDE VACCINE AGE 75-64 HIGH RISK  04/12/1963  . OPHTHALMOLOGY EXAM  04/12/1971  . HIV Screening  04/11/1976  . FOOT EXAM  04/10/2017  . PAP SMEAR-Modifier  08/13/2018  . HEMOGLOBIN A1C  09/09/2019  . MAMMOGRAM  06/01/2021  . COLONOSCOPY  07/07/2022  . TETANUS/TDAP  11/24/2023  . INFLUENZA VACCINE  Completed  . Hepatitis  C Screening  Completed     Discussed health benefits of physical activity, and encouraged her to engage in regular exercise appropriate for her age and condition.    --------------------------------------------------------------------  1. Annual physical exam Patient advised to take vitamin D daily.  She is taking fish oil daily Well woman exam per Dr. Leafy Ro   2. Diabetes mellitus without complication (HCC) 123456 today is 5.9. - POCT glycosylated  hemoglobin (Hb A1C)  3. Class 3 severe obesity due to excess calories with serious comorbidity and body mass index (BMI) of 40.0 to 44.9 in adult [facility]) Diet and exercise stressed. Told her exercise as allowed by her back. 4. Chronic bilateral low back pain with bilateral sciatica Offered referral to pain clinic.  She wishes to wait.  She is briefly tearful during the exam today because of her back pain.  She denies she is depressed or anxious.  Currently not suicidal or homicidal.   Follow up in 4-6 months.    I,Jahari Billy,acting as a scribe for [name], MD.,have documented all relevant documentation on the behalf of [name], MD,as directed by  [name], MD while in the presence of [name], MD.    [name], MD  [facility]

## 2019-07-19 ENCOUNTER — Ambulatory Visit: Payer: BC Managed Care – PPO | Attending: Internal Medicine

## 2019-07-19 DIAGNOSIS — Z23 Encounter for immunization: Secondary | ICD-10-CM | POA: Insufficient documentation

## 2019-07-19 NOTE — Progress Notes (Signed)
   Covid-19 Vaccination Clinic  Name:  Cheryl Yang    MRN: [id number] DOB: 02-07-1961  07/19/2019  [name] was observed post Covid-19 immunization for 15 minutes without incident. She was provided with Vaccine Information Sheet and instruction to access the V-Safe system.   [name] was instructed to call 911 with any severe reactions post vaccine: [place] Kitchen Difficulty breathing  . Swelling of face and throat  . A fast heartbeat  . A bad rash all over body  . Dizziness and weakness   Immunizations Administered    Name Date Dose VIS Date Route   Pfizer COVID-19 Vaccine 07/19/2019 11:11 AM 0.3 mL 04/24/2019 Intramuscular   Manufacturer: [place]   Lot: [id number]   [place]: [id number]

## 2019-07-20 ENCOUNTER — Encounter: Payer: Self-pay | Admitting: Family Medicine

## 2019-07-20 ENCOUNTER — Ambulatory Visit (INDEPENDENT_AMBULATORY_CARE_PROVIDER_SITE_OTHER): Payer: BC Managed Care – PPO | Admitting: Family Medicine

## 2019-07-20 ENCOUNTER — Other Ambulatory Visit: Payer: Self-pay

## 2019-07-20 VITALS — BP 134/88 | HR 85 | Temp 97.3°F | Resp 16 | Ht 65.0 in | Wt 245.0 lb

## 2019-07-20 DIAGNOSIS — Z6841 Body Mass Index (BMI) 40.0 and over, adult: Secondary | ICD-10-CM

## 2019-07-20 DIAGNOSIS — E119 Type 2 diabetes mellitus without complications: Secondary | ICD-10-CM | POA: Diagnosis not present

## 2019-07-20 DIAGNOSIS — M5441 Lumbago with sciatica, right side: Secondary | ICD-10-CM

## 2019-07-20 DIAGNOSIS — M5442 Lumbago with sciatica, left side: Secondary | ICD-10-CM | POA: Diagnosis not present

## 2019-07-20 DIAGNOSIS — G8929 Other chronic pain: Secondary | ICD-10-CM

## 2019-07-20 DIAGNOSIS — Z0000 Encounter for general adult medical examination without abnormal findings: Secondary | ICD-10-CM

## 2019-07-20 LAB — POCT GLYCOSYLATED HEMOGLOBIN (HGB A1C)
Est. average glucose Bld gHb Est-mCnc: 123
Hemoglobin A1C: 5.9 % — AB (ref 4.0–5.6)

## 2019-08-11 ENCOUNTER — Ambulatory Visit: Payer: BC Managed Care – PPO | Attending: Internal Medicine

## 2019-08-11 DIAGNOSIS — Z23 Encounter for immunization: Secondary | ICD-10-CM

## 2019-08-11 NOTE — Progress Notes (Signed)
   Covid-19 Vaccination Clinic  Name:  SHANTAI SHOBE    MRN: [id number] DOB: [birth year]  08/11/2019  Ms. Minichiello was observed post Covid-19 immunization for 15 minutes without incident. She was provided with Vaccine Information Sheet and instruction to access the V-Safe system.   Ms. Kincaid was instructed to call 911 with any severe reactions post vaccine: [place] Kitchen Difficulty breathing  . Swelling of face and throat  . A fast heartbeat  . A bad rash all over body  . Dizziness and weakness   Immunizations Administered    Name Date Dose VIS Date Route   Pfizer COVID-19 Vaccine 08/11/2019  8:42 AM 0.3 mL 04/24/2019 Intramuscular   Manufacturer: [place]   Lot: [id number]   [place]: [id number]

## 2019-09-03 ENCOUNTER — Other Ambulatory Visit: Payer: Self-pay | Admitting: Family Medicine

## 2019-10-20 ENCOUNTER — Other Ambulatory Visit: Payer: Self-pay | Admitting: Family Medicine

## 2019-10-22 ENCOUNTER — Other Ambulatory Visit: Payer: Self-pay | Admitting: Family Medicine

## 2019-10-22 DIAGNOSIS — J309 Allergic rhinitis, unspecified: Secondary | ICD-10-CM

## 2019-11-13 ENCOUNTER — Other Ambulatory Visit: Payer: Self-pay | Admitting: Family Medicine

## 2019-11-13 DIAGNOSIS — E119 Type 2 diabetes mellitus without complications: Secondary | ICD-10-CM

## 2019-11-23 ENCOUNTER — Other Ambulatory Visit: Payer: Self-pay | Admitting: Family Medicine

## 2019-11-28 ENCOUNTER — Other Ambulatory Visit: Payer: Self-pay | Admitting: Family Medicine

## 2019-11-28 NOTE — Telephone Encounter (Signed)
Filled for 90 days due to need for upcoming Lipid profile Requested Prescriptions  Pending Prescriptions Disp Refills  . rosuvastatin (CRESTOR) 5 MG tablet [Pharmacy Med Name: ROSUVASTATIN CALCIUM 5 MG TAB] 90 tablet 0    Sig: TAKE 1 TABLET (5 MG TOTAL) BY MOUTH AT BEDTIME.     Cardiovascular:  Antilipid - Statins Failed - 11/28/2019  9:59 AM      Failed - LDL in normal range and within 360 days    LDL Chol Calc (NIH)  Date Value Ref Range Status  03/11/2019 76 0 - 99 mg/dL Final         Passed - Total Cholesterol in normal range and within 360 days    Cholesterol, Total  Date Value Ref Range Status  03/11/2019 137 100 - 199 mg/dL Final         Passed - HDL in normal range and within 360 days    HDL  Date Value Ref Range Status  03/11/2019 40 >39 mg/dL Final         Passed - Triglycerides in normal range and within 360 days    Triglycerides  Date Value Ref Range Status  03/11/2019 115 0 - 149 mg/dL Final         Passed - Patient is not pregnant      Passed - Valid encounter within last 12 months    Recent Outpatient Visits          4 months ago Annual physical exam   [facility] Jerrol Banana., MD   8 months ago Diabetes mellitus without complication [facility])   [facility] Jerrol Banana., MD   1 year ago Borderline diabetes   [facility] Jerrol Banana., MD   1 year ago Flu-like symptoms   [facility] Jerrol Banana., MD   1 year ago Borderline diabetes   [facility] Jerrol Banana., MD      Future Appointments            In 1 month Jerrol Banana., MD [facility], [place]

## 2020-01-01 NOTE — Progress Notes (Signed)
I,Cheryl Yang,acting as a scribe for [name], MD.,have documented all relevant documentation on the behalf of [name], MD,as directed by  [name], MD while in the presence of [name], MD.   Established patient visit   Patient: Cheryl Yang   DOB: Jul 21, 1960   59 y.o. Female  MRN: [id number] Visit Date: 01/04/2020  Today's healthcare provider: [name], MD   Chief Complaint  Patient presents with  . Diabetes  . Follow-up  . Hyperlipidemia  . Hypertension   Subjective    HPI  Patient comes in today for follow-up.  She has chronic manageable back pain.  She is doing well with her medication.  She is not exercising.  She does complain of chronic neuropathy of her feet. Diabetes Mellitus Type II, follow-up  Lab Results  Component Value Date   HGBA1C 6.3 (A) 01/04/2020   HGBA1C 5.9 (A) 07/20/2019   HGBA1C 6.3 (A) 03/11/2019   Last seen for diabetes 5 months ago.  Management since then includes continuing the same treatment. She reports good compliance with treatment. She is not having side effects. none  Home blood sugar records: fasting range: 90-140  Episodes of hypoglycemia? No none   Current insulin regiment: n/a Most Recent Eye Exam: due  --------------------------------------------------------------------  Hypertension, follow-up  BP Readings from Last 3 Encounters:  01/04/20 121/71  07/20/19 134/88  03/11/19 121/71   Wt Readings from Last 3 Encounters:  01/04/20 239 lb (108.4 kg)  07/20/19 245 lb (111.1 kg)  03/11/19 247 lb (112 kg)     She was last seen for hypertension 5 months ago.  BP at that visit was 134/88. Management since that visit includes; telmisartan-hydrochlorothiazide. She reports good compliance with treatment. She is not having side effects. none She is not exercising. She is not adherent to low salt diet.   Outside blood pressures are 115/70.  She does not smoke.  Use of  agents associated with hypertension: none.   --------------------------------------------------------------------  Lipid/Cholesterol, follow-up  Last Lipid Panel: Lab Results  Component Value Date   CHOL 137 03/11/2019   LDLCALC 76 03/11/2019   HDL 40 03/11/2019   TRIG 115 03/11/2019    She was last seen for this 10 months ago.  Management since that visit includes; on rosuvastatin. She reports good compliance with treatment. She is not having side effects. none She is following a Regular diet. Current exercise: none  Last metabolic panel Lab Results  Component Value Date   GLUCOSE 117 (H) 03/11/2019   NA 144 03/11/2019   K 4.2 03/11/2019   BUN 17 03/11/2019   CREATININE 0.79 03/11/2019   GFRNONAA 83 03/11/2019   GFRAA 96 03/11/2019   CALCIUM 9.9 03/11/2019   AST 22 03/11/2019   ALT 26 03/11/2019   The 10-year ASCVD risk score Mikey Bussing DC Jr., et al., 2013) is: 5.7%  --------------------------------------------------------------------  Class 3 severe obesity due to excess calories with serious comorbidity and body mass index (BMI) of 40.0 to 44.9 in adult [facility]) From 07/20/2019-Diet and exercise stressed. Told her exercise as allowed by her back.  Chronic bilateral low back pain with bilateral sciatica From 07/20/2019-Offered referral to pain clinic.  She wishes to wait.  She is briefly tearful during the exam today because of her back pain.  She denies she is depressed or anxious.  Currently not suicidal or homicidal.      Medications: Outpatient Medications Prior to Visit  Medication Sig  .  clotrimazole-betamethasone (LOTRISONE) cream Apply 1 application topically 2 (two) times daily.  . Coenzyme Q10 (CO Q10) 100 MG CAPS Take 200 mg by mouth daily.  . fexofenadine (ALLEGRA) 180 MG tablet Take 180 mg by mouth daily.  Marland Kitchen gabapentin (NEURONTIN) 300 MG capsule Take 2 capsules (600 mg total) by mouth 2 (two) times daily. 3 tablets daily  . glucose blood test strip Check  sugar once daily DX E11.9, patient has one touch ultra mini and needs strips and lancets for this  . metFORMIN (GLUCOPHAGE) 500 MG tablet TAKE 1 TABLET BY MOUTH EVERY DAY WITH BREAKFAST  . montelukast (SINGULAIR) 10 MG tablet TAKE 1 TABLET BY MOUTH EVERYDAY AT BEDTIME  . naproxen sodium (ALEVE) 220 MG tablet Take 220 mg by mouth.  . Omega-3 Fatty Acids (OMEGA 3 PO) Take by mouth.  . rosuvastatin (CRESTOR) 5 MG tablet TAKE 1 TABLET (5 MG TOTAL) BY MOUTH AT BEDTIME.  Marland Kitchen sertraline (ZOLOFT) 100 MG tablet TAKE 1 TABLET BY MOUTH EVERY DAY  . telmisartan-hydrochlorothiazide (MICARDIS HCT) 40-12.5 MG tablet TAKE 1 TABLET BY MOUTH EVERY DAY  . triamcinolone (NASACORT ALLERGY 24HR) 55 MCG/ACT AERO nasal inhaler Place into the nose daily.  Marland Kitchen VITAMIN D PO Take 1,000 Units by mouth daily at 12 noon.  . chlorpheniramine-HYDROcodone (TUSSIONEX PENNKINETIC ER) 10-8 MG/5ML SUER Take 5 mLs by mouth 2 (two) times daily. (Patient not taking: Reported on 07/20/2019)  . HYDROcodone-acetaminophen (NORCO/VICODIN) 5-325 MG tablet Take 1 tablet by mouth every 6 (six) hours as needed for moderate pain. (Patient not taking: Reported on 02/18/2018)  . omeprazole (PRILOSEC) 20 MG capsule Take 1 capsule (20 mg total) by mouth daily as needed (for indigestion). (Patient not taking: Reported on 10/02/2017)   No facility-administered medications prior to visit.    Review of Systems  Constitutional: Negative for appetite change, chills, fatigue and fever.  Respiratory: Negative for chest tightness and shortness of breath.   Cardiovascular: Negative for chest pain and palpitations.  Gastrointestinal: Negative for abdominal pain, nausea and vomiting.  Neurological: Positive for numbness. Negative for dizziness and weakness.  Hematological: Negative.   Psychiatric/Behavioral: Negative.     Last hemoglobin A1c Lab Results  Component Value Date   HGBA1C 6.3 (A) 01/04/2020      Objective    BP 121/71 (BP Location: Right Arm,  Patient Position: Sitting, Cuff Size: Large)   Pulse 74   Temp 98.5 F (36.9 C) (Oral)   Resp 16   Ht 5\' 5"  (1.651 m)   Wt 239 lb (108.4 kg)   SpO2 95%   BMI 39.77 kg/m  BP Readings from Last 3 Encounters:  01/04/20 121/71  07/20/19 134/88  03/11/19 121/71   Wt Readings from Last 3 Encounters:  01/04/20 239 lb (108.4 kg)  07/20/19 245 lb (111.1 kg)  03/11/19 247 lb (112 kg)      Physical Exam  BP 121/71 (BP Location: Right Arm, Patient Position: Sitting, Cuff Size: Large)   Pulse 74   Temp 98.5 F (36.9 C) (Oral)   Resp 16   Ht 5\' 5"  (1.651 m)   Wt 239 lb (108.4 kg)   SpO2 95%   BMI 39.77 kg/m   General Appearance:    Alert, cooperative, no distress, appears stated age  Head:    Normocephalic, without obvious abnormality, atraumatic  Eyes:    PERRL, conjunctiva/corneas clear, EOM's intact, fundi    benign, both eyes  Ears:    Normal TM's and external ear canals, both ears  Nose:   Nares normal, septum midline, mucosa normal, no drainage    or sinus tenderness  Throat:   Lips, mucosa, and tongue normal; teeth and gums normal  Neck:   Supple, symmetrical, trachea midline, no adenopathy;    thyroid:  no enlargement/tenderness/nodules; no carotid   bruit or JVD  Back:     Symmetric, no curvature, ROM normal, no CVA tenderness  Lungs:     Clear to auscultation bilaterally, respirations unlabored  Chest Wall:    No tenderness or deformity   Heart:    Regular rate and rhythm, S1 and S2 normal, no murmur, rub   or gallop  Breast Exam:    No tenderness, masses, or nipple abnormality  Abdomen:     Soft, non-tender, bowel sounds active all four quadrants,    no masses, no organomegaly  Genitalia:    Normal female without lesion, discharge or tenderness  Rectal:    Normal tone, normal prostate, no masses or tenderness;   guaiac negative stool  Extremities:   Extremities normal, atraumatic, no cyanosis or edema  Pulses:   2+ and symmetric all extremities  Skin:   Skin  color, texture, turgor normal, no rashes or lesions  Lymph nodes:   Cervical, supraclavicular, and axillary nodes normal  Neurologic:   CNII-XII intact, normal strength, sensation and reflexes    throughout  Diabetic foot exam is normal.   Results for orders placed or performed in visit on 01/04/20  POCT glycosylated hemoglobin (Hb A1C)  Result Value Ref Range   Hemoglobin A1C 6.3 (A) 4.0 - 5.6 %   Est. average glucose Bld gHb Est-mCnc 134     Assessment & Plan     1. Essential (primary) hypertension  - Lipid panel - CBC w/Diff/Platelet - Comprehensive Metabolic Panel (CMET)  2. Other hyperlipidemia  - Lipid panel - CBC w/Diff/Platelet - Comprehensive Metabolic Panel (CMET)  3. Diabetes mellitus without complication (HCC) 6.3 X4G today.  Good control.  Continue to work on diet and exercise - POCT glycosylated hemoglobin (Hb A1C) - Lipid panel - CBC w/Diff/Platelet - Comprehensive Metabolic Panel (CMET)  4. Need for influenza vaccination  - Flu Vaccine QUAD 36+ mos IM (Fluarix/Fluzone)  5. Need for shingles vaccine  - Zoster, Recombinant (Shingrix)  6. Neuropathy  - Vitamin B12   Return in 7 months (on 07/21/2020) for CPE.         Bharat Antillon Cranford Mon, MD  [facility] [phone] (phone) [phone] (fax)  [place]

## 2020-01-04 ENCOUNTER — Other Ambulatory Visit: Payer: Self-pay

## 2020-01-04 ENCOUNTER — Ambulatory Visit: Payer: BC Managed Care – PPO | Admitting: Family Medicine

## 2020-01-04 ENCOUNTER — Other Ambulatory Visit: Payer: Self-pay | Admitting: Family Medicine

## 2020-01-04 ENCOUNTER — Encounter: Payer: Self-pay | Admitting: Family Medicine

## 2020-01-04 VITALS — BP 121/71 | HR 74 | Temp 98.5°F | Resp 16 | Ht 65.0 in | Wt 239.0 lb

## 2020-01-04 DIAGNOSIS — G629 Polyneuropathy, unspecified: Secondary | ICD-10-CM | POA: Diagnosis not present

## 2020-01-04 DIAGNOSIS — I10 Essential (primary) hypertension: Secondary | ICD-10-CM | POA: Diagnosis not present

## 2020-01-04 DIAGNOSIS — E119 Type 2 diabetes mellitus without complications: Secondary | ICD-10-CM | POA: Diagnosis not present

## 2020-01-04 DIAGNOSIS — Z23 Encounter for immunization: Secondary | ICD-10-CM | POA: Diagnosis not present

## 2020-01-04 DIAGNOSIS — E7849 Other hyperlipidemia: Secondary | ICD-10-CM

## 2020-01-04 LAB — POCT GLYCOSYLATED HEMOGLOBIN (HGB A1C)
Est. average glucose Bld gHb Est-mCnc: 134
Hemoglobin A1C: 6.3 % — AB (ref 4.0–5.6)

## 2020-01-05 LAB — COMPREHENSIVE METABOLIC PANEL
ALT: 22 IU/L (ref 0–32)
AST: 21 IU/L (ref 0–40)
Albumin/Globulin Ratio: 1.9 (ref 1.2–2.2)
Albumin: 4.6 g/dL (ref 3.8–4.9)
Alkaline Phosphatase: 90 IU/L (ref 48–121)
BUN/Creatinine Ratio: 19 (ref 9–23)
BUN: 13 mg/dL (ref 6–24)
Bilirubin Total: 0.5 mg/dL (ref 0.0–1.2)
CO2: 27 mmol/L (ref 20–29)
Calcium: 9.5 mg/dL (ref 8.7–10.2)
Chloride: 98 mmol/L (ref 96–106)
Creatinine, Ser: 0.69 mg/dL (ref 0.57–1.00)
GFR calc Af Amer: 111 mL/min/{1.73_m2} (ref >59)
GFR calc non Af Amer: 96 mL/min/{1.73_m2} (ref >59)
Globulin, Total: 2.4 g/dL (ref 1.5–4.5)
Glucose: 85 mg/dL (ref 65–99)
Potassium: 3.6 mmol/L (ref 3.5–5.2)
Sodium: 140 mmol/L (ref 134–144)
Total Protein: 7 g/dL (ref 6.0–8.5)

## 2020-01-05 LAB — CBC WITH DIFFERENTIAL/PLATELET
Basophils Absolute: 0 10*3/uL (ref 0.0–0.2)
Basos: 0 %
EOS (ABSOLUTE): 0.2 10*3/uL (ref 0.0–0.4)
Eos: 2 %
Hematocrit: 39.6 % (ref 34.0–46.6)
Hemoglobin: 12.8 g/dL (ref 11.1–15.9)
Immature Grans (Abs): 0 10*3/uL (ref 0.0–0.1)
Immature Granulocytes: 0 %
Lymphocytes Absolute: 2.7 10*3/uL (ref 0.7–3.1)
Lymphs: 31 %
MCH: 27.3 pg (ref 26.6–33.0)
MCHC: 32.3 g/dL (ref 31.5–35.7)
MCV: 84 fL (ref 79–97)
Monocytes Absolute: 0.7 10*3/uL (ref 0.1–0.9)
Monocytes: 8 %
Neutrophils Absolute: 5 10*3/uL (ref 1.4–7.0)
Neutrophils: 59 %
Platelets: 256 10*3/uL (ref 150–450)
RBC: 4.69 x10E6/uL (ref 3.77–5.28)
RDW: 13.5 % (ref 11.7–15.4)
WBC: 8.7 10*3/uL (ref 3.4–10.8)

## 2020-01-05 LAB — LIPID PANEL
Chol/HDL Ratio: 3.4 ratio (ref 0.0–4.4)
Cholesterol, Total: 142 mg/dL (ref 100–199)
HDL: 42 mg/dL (ref >39)
LDL Chol Calc (NIH): 80 mg/dL (ref 0–99)
Triglycerides: 107 mg/dL (ref 0–149)
VLDL Cholesterol Cal: 20 mg/dL (ref 5–40)

## 2020-01-05 LAB — VITAMIN B12: Vitamin B-12: 212 pg/mL — ABNORMAL LOW (ref 232–1245)

## 2020-01-07 ENCOUNTER — Telehealth: Payer: Self-pay

## 2020-01-07 NOTE — Telephone Encounter (Signed)
Called to advise patient of lab results and no answer. LVMTCB and also let patient know that results were also sent through mychart. If patient calls back it is okay for someone else to advise.

## 2020-01-07 NOTE — Telephone Encounter (Signed)
-----   Message from Jerrol Banana., MD sent at 01/07/2020 12:58 PM EDT ----- Labs okay, B12 slightly low.  Would start over-the-counter B12 supplement daily, 1000 mcg daily

## 2020-01-08 NOTE — Telephone Encounter (Signed)
Pt given lab results per notes of Dr. Rosanna Randy on 01/07/20. Pt verbalized understanding.

## 2020-01-20 ENCOUNTER — Ambulatory Visit: Payer: Self-pay | Admitting: Family Medicine

## 2020-02-05 ENCOUNTER — Other Ambulatory Visit: Payer: Self-pay | Admitting: Family Medicine

## 2020-02-05 DIAGNOSIS — E119 Type 2 diabetes mellitus without complications: Secondary | ICD-10-CM

## 2020-02-19 ENCOUNTER — Other Ambulatory Visit: Payer: Self-pay | Admitting: Family Medicine

## 2020-03-01 ENCOUNTER — Other Ambulatory Visit: Payer: Self-pay | Admitting: Family Medicine

## 2020-03-01 NOTE — Telephone Encounter (Signed)
Requested Prescriptions  Pending Prescriptions Disp Refills   telmisartan-hydrochlorothiazide (MICARDIS HCT) 40-12.5 MG tablet [Pharmacy Med Name: TELMISARTAN-HCTZ 40-12.5 MG TB] 90 tablet 1    Sig: TAKE 1 TABLET BY MOUTH EVERY DAY     Cardiovascular: ARB + Diuretic Combos Passed - 03/01/2020  1:29 AM      Passed - K in normal range and within 180 days    Potassium  Date Value Ref Range Status  01/04/2020 3.6 3.5 - 5.2 mmol/L Final         Passed - Na in normal range and within 180 days    Sodium  Date Value Ref Range Status  01/04/2020 140 134 - 144 mmol/L Final         Passed - Cr in normal range and within 180 days    Creatinine  Date Value Ref Range Status  05/28/2014 0.97 0.60 - 1.30 mg/dL Final   Creatinine, Ser  Date Value Ref Range Status  01/04/2020 0.69 0.57 - 1.00 mg/dL Final         Passed - Ca in normal range and within 180 days    Calcium  Date Value Ref Range Status  01/04/2020 9.5 8.7 - 10.2 mg/dL Final         Passed - Patient is not pregnant      Passed - Last BP in normal range    BP Readings from Last 1 Encounters:  01/04/20 121/71         Passed - Valid encounter within last 6 months    Recent Outpatient Visits          1 month ago Essential (primary) hypertension   [facility] Jerrol Banana., MD   7 months ago Annual physical exam   [facility] Jerrol Banana., MD   11 months ago Diabetes mellitus without complication [facility])   [facility] Jerrol Banana., MD   1 year ago Borderline diabetes   [facility] Jerrol Banana., MD   1 year ago Flu-like symptoms   [facility] Jerrol Banana., MD      Future Appointments            In 4 months Jerrol Banana., MD [facility], [place]

## 2020-03-09 ENCOUNTER — Other Ambulatory Visit: Payer: Self-pay

## 2020-03-09 ENCOUNTER — Ambulatory Visit (INDEPENDENT_AMBULATORY_CARE_PROVIDER_SITE_OTHER): Payer: BC Managed Care – PPO | Admitting: Family Medicine

## 2020-03-09 DIAGNOSIS — Z23 Encounter for immunization: Secondary | ICD-10-CM

## 2020-03-09 NOTE — Progress Notes (Signed)
Vaccine only

## 2020-03-14 ENCOUNTER — Ambulatory Visit: Payer: Self-pay | Admitting: Family Medicine

## 2020-03-29 ENCOUNTER — Other Ambulatory Visit: Payer: Self-pay | Admitting: Family Medicine

## 2020-03-30 MED ORDER — ROSUVASTATIN CALCIUM 5 MG PO TABS
5.0000 mg | ORAL_TABLET | Freq: Every day | ORAL | 0 refills | Status: DC
Start: 2020-03-30 — End: 2020-09-23

## 2020-07-14 ENCOUNTER — Other Ambulatory Visit: Payer: Self-pay | Admitting: Family Medicine

## 2020-07-14 DIAGNOSIS — J309 Allergic rhinitis, unspecified: Secondary | ICD-10-CM

## 2020-07-21 ENCOUNTER — Other Ambulatory Visit: Payer: Self-pay

## 2020-07-21 ENCOUNTER — Ambulatory Visit (INDEPENDENT_AMBULATORY_CARE_PROVIDER_SITE_OTHER): Payer: BC Managed Care – PPO | Admitting: Family Medicine

## 2020-07-21 ENCOUNTER — Encounter: Payer: Self-pay | Admitting: Family Medicine

## 2020-07-21 VITALS — BP 127/80 | HR 82 | Temp 98.2°F | Resp 16 | Ht 65.0 in | Wt 245.0 lb

## 2020-07-21 DIAGNOSIS — E119 Type 2 diabetes mellitus without complications: Secondary | ICD-10-CM

## 2020-07-21 DIAGNOSIS — Z0000 Encounter for general adult medical examination without abnormal findings: Secondary | ICD-10-CM | POA: Diagnosis not present

## 2020-07-21 LAB — POCT GLYCOSYLATED HEMOGLOBIN (HGB A1C)
Est. average glucose Bld gHb Est-mCnc: 128
Hemoglobin A1C: 6.1 % — AB (ref 4.0–5.6)

## 2020-07-21 NOTE — Progress Notes (Signed)
I,[name],acting as a scribe for Cheryl Durie, MD.,have documented all relevant documentation on the behalf of Cheryl Durie, MD,as directed by  Cheryl Durie, MD while in the presence of Cheryl Durie, MD.   Complete physical exam   Patient: Cheryl Yang   DOB: 1961/03/10   60 y.o. Female  MRN: [id number] Visit Date: 07/21/2020  Today's healthcare provider: [name], MD   Chief Complaint  Patient presents with  . Annual Exam   Subjective    Cheryl Yang is a 60 y.o. female who presents today for a complete physical exam.  She reports consuming a low carb diet. Home exercise routine includes bike. She generally feels well. She reports sleeping fairly well. She does not have additional problems to discuss today.  Her son is getting married but she has not seen her granddaughter for 6 months and her daughter is going through a divorce but evidently is contentious. HPI    Past Medical History:  Diagnosis Date  . Anemia    due to fibroids (has had hysterectomy)  . Anxiety   . Arthritis   . Depression   . Eczema   . Environmental and seasonal allergies   . GERD (gastroesophageal reflux disease)    occ, uses Prilosec as needed with anti-imflammatories  . Hypertension   . PONV (postoperative nausea and vomiting)    Past Surgical History:  Procedure Laterality Date  . BACK SURGERY     lumbar surgery  . COLONOSCOPY    . EYE SURGERY Left    had hole in eye repaired  . VAGINAL HYSTERECTOMY     Social History   Socioeconomic History  . Marital status: Married    Spouse name: Not on file  . Number of children: Not on file  . Years of education: Not on file  . Highest education level: Not on file  Occupational History  . Not on file  Tobacco Use  . Smoking status: Never Smoker  . Smokeless tobacco: Never Used  Substance and Sexual Activity  . Alcohol use: Yes    Alcohol/week: 0.0 standard drinks    Comment: very rare  . Drug  use: No  . Sexual activity: Yes  Other Topics Concern  . Not on file  Social History Narrative  . Not on file   Social Determinants of Health   Financial Resource Strain: Not on file  Food Insecurity: Not on file  Transportation Needs: Not on file  Physical Activity: Not on file  Stress: Not on file  Social Connections: Not on file  Intimate Partner Violence: Not on file   Family Status  Relation Name Status  . Mother  Alive  . Father  Deceased       due to MVA  . Brother  Alive  . Daughter  Alive  . Son  Alive  . Brother  Alive  . Neg Hx  (Not Specified)   Family History  Problem Relation Age of Onset  . Heart attack Mother   . CVA Mother   . Restless legs syndrome Mother   . Breast cancer Neg Hx    Allergies  Allergen Reactions  . Salmon [Fish Allergy] Itching  . Seldane [Terfenadine] Rash  . Tagamet [Cimetidine] Itching and Rash    Patient Care Team: Jerrol Banana., MD as PCP - General (Family Medicine)   Medications: Outpatient Medications Prior to Visit  Medication Sig  . clotrimazole-betamethasone (LOTRISONE) cream Apply  1 application topically 2 (two) times daily.  . Coenzyme Q10 (CO Q10) 100 MG CAPS Take 200 mg by mouth daily.  Marland Kitchen gabapentin (NEURONTIN) 300 MG capsule Take 2 capsules (600 mg total) by mouth 2 (two) times daily. 3 tablets daily  . glucose blood test strip Check sugar once daily DX E11.9, patient has one touch ultra mini and needs strips and lancets for this  . metFORMIN (GLUCOPHAGE) 500 MG tablet TAKE 1 TABLET BY MOUTH EVERY DAY WITH BREAKFAST  . montelukast (SINGULAIR) 10 MG tablet TAKE 1 TABLET BY MOUTH EVERYDAY AT BEDTIME  . naproxen sodium (ALEVE) 220 MG tablet Take 220 mg by mouth.  . Omega-3 Fatty Acids (OMEGA 3 PO) Take by mouth.  . rosuvastatin (CRESTOR) 5 MG tablet Take 1 tablet (5 mg total) by mouth at bedtime.  . sertraline (ZOLOFT) 100 MG tablet TAKE 1 TABLET BY MOUTH EVERY DAY  . telmisartan-hydrochlorothiazide  (MICARDIS HCT) 40-12.5 MG tablet TAKE 1 TABLET BY MOUTH EVERY DAY  . triamcinolone (NASACORT) 55 MCG/ACT AERO nasal inhaler Place into the nose daily.  Marland Kitchen VITAMIN D PO Take 1,000 Units by mouth daily at 12 noon.  . chlorpheniramine-HYDROcodone (TUSSIONEX PENNKINETIC ER) 10-8 MG/5ML SUER Take 5 mLs by mouth 2 (two) times daily. (Patient not taking: No sig reported)  . fexofenadine (ALLEGRA) 180 MG tablet Take 180 mg by mouth daily.  Marland Kitchen HYDROcodone-acetaminophen (NORCO/VICODIN) 5-325 MG tablet Take 1 tablet by mouth every 6 (six) hours as needed for moderate pain. (Patient not taking: No sig reported)  . omeprazole (PRILOSEC) 20 MG capsule Take 1 capsule (20 mg total) by mouth daily as needed (for indigestion). (Patient not taking: No sig reported)   No facility-administered medications prior to visit.    Review of Systems  HENT: Positive for sinus pressure.   Eyes: Positive for discharge and itching.  Musculoskeletal: Positive for back pain.  Neurological: Positive for numbness.  All other systems reviewed and are negative.     Objective    BP 127/80 (BP Location: Left Arm, Patient Position: Sitting, Cuff Size: Large)   Pulse 82   Temp 98.2 F (36.8 C) (Oral)   Resp 16   Ht 5\' 5"  (1.651 m)   Wt 245 lb (111.1 kg)   SpO2 96%   BMI 40.77 kg/m    Physical Exam Vitals reviewed.  Constitutional:      Appearance: She is well-developed. She is obese.  HENT:     Head: Normocephalic and atraumatic.     Right Ear: Tympanic membrane, ear canal and external ear normal.     Left Ear: Tympanic membrane, ear canal and external ear normal.     Mouth/Throat:     Pharynx: Oropharynx is clear.  Eyes:     General: No scleral icterus.    Extraocular Movements: Extraocular movements intact.     Conjunctiva/sclera: Conjunctivae normal.     Pupils: Pupils are equal, round, and reactive to light.  Neck:     Thyroid: No thyromegaly.     Vascular: No carotid bruit.  Cardiovascular:     Rate and  Rhythm: Normal rate and regular rhythm.     Heart sounds: Normal heart sounds.  Pulmonary:     Effort: Pulmonary effort is normal.     Breath sounds: Normal breath sounds.  Abdominal:     Palpations: Abdomen is soft.  Lymphadenopathy:     Cervical: No cervical adenopathy.  Skin:    General: Skin is warm and dry.  Neurological:  General: No focal deficit present.     Mental Status: She is alert and oriented to person, place, and time.     Motor: No weakness.  Psychiatric:        Mood and Affect: Mood normal.        Behavior: Behavior normal.        Thought Content: Thought content normal.        Judgment: Judgment normal.       Last depression screening scores PHQ 2/9 Scores 07/21/2020 07/20/2019 06/23/2018  PHQ - 2 Score 0 0 0  PHQ- 9 Score 0 0 -   Last fall risk screening Fall Risk  06/23/2018  Falls in the past year? 0   Last Audit-C alcohol use screening Alcohol Use Disorder Test (AUDIT) 07/21/2020  1. How often do you have a drink containing alcohol? 0  2. How many drinks containing alcohol do you have on a typical day when you are drinking? 0  3. How often do you have six or more drinks on one occasion? 0  AUDIT-C Score 0  Alcohol Brief Interventions/Follow-up AUDIT Score <7 follow-up not indicated   A score of 3 or more in women, and 4 or more in men indicates increased risk for alcohol abuse, EXCEPT if all of the points are from question 1   Results for orders placed or performed in visit on 07/21/20  POCT glycosylated hemoglobin (Hb A1C)  Result Value Ref Range   Hemoglobin A1C 6.1 (A) 4.0 - 5.6 %   Est. average glucose Bld gHb Est-mCnc 128     Assessment & Plan    Routine Health Maintenance and Physical Exam  Exercise Activities and Dietary recommendations Goals   None     Immunization History  Administered Date(s) Administered  . Influenza Inj Mdck Quad Pf 02/28/2017  . Influenza,inj,Quad PF,6+ Mos 02/18/2018, 03/02/2019, 01/04/2020  .  Influenza-Unspecified 03/20/2015  . PFIZER(Purple Top)SARS-COV-2 Vaccination 07/19/2019, 08/11/2019  . Td 04/29/2003  . Tdap 11/23/2013  . Zoster Recombinat (Shingrix) 01/04/2020, 03/09/2020    Health Maintenance  Topic Date Due  . PNEUMOCOCCAL POLYSACCHARIDE VACCINE AGE 30-64 HIGH RISK  Never done  . OPHTHALMOLOGY EXAM  Never done  . HIV Screening  Never done  . FOOT EXAM  04/10/2017  . PAP SMEAR-Modifier  08/13/2018  . COVID-19 Vaccine (3 - Booster for Pfizer series) 02/11/2020  . HEMOGLOBIN A1C  01/21/2021  . MAMMOGRAM  06/01/2021  . COLONOSCOPY (Pts 45-7yrs Insurance coverage will need to be confirmed)  07/07/2022  . TETANUS/TDAP  11/24/2023  . INFLUENZA VACCINE  Completed  . Hepatitis C Screening  Completed  . HPV VACCINES  Aged Out    Discussed health benefits of physical activity, and encouraged her to engage in regular exercise appropriate for her age and condition.  1. Annual physical exam Well woman exam per GYN.  2. Diabetes mellitus without complication ([place]) Well-controlled with A1c of 6.1 today.  An exercise is stressed. - POCT glycosylated hemoglobin (Hb A1C)--.6.1 today.   Return in about 18 weeks (around 11/24/2020).     I, Cheryl Durie, MD, have reviewed all documentation for this visit. The documentation on 07/23/20 for the exam, diagnosis, procedures, and orders are all accurate and complete.    Richard Cranford Mon, MD  [facility] [phone] (phone) [phone] (fax)  [place]

## 2020-07-28 ENCOUNTER — Other Ambulatory Visit: Payer: Self-pay | Admitting: Family Medicine

## 2020-07-28 DIAGNOSIS — E119 Type 2 diabetes mellitus without complications: Secondary | ICD-10-CM

## 2020-07-28 NOTE — Telephone Encounter (Signed)
Requested Prescriptions  Pending Prescriptions Disp Refills  . metFORMIN (GLUCOPHAGE) 500 MG tablet [Pharmacy Med Name: METFORMIN HCL 500 MG TABLET] 90 tablet 1    Sig: TAKE 1 TABLET BY MOUTH [place]     Endocrinology:  Diabetes - Biguanides Passed - 07/28/2020  1:47 AM      Passed - Cr in normal range and within 360 days    Creatinine  Date Value Ref Range Status  05/28/2014 0.97 0.60 - 1.30 mg/dL Final   Creatinine, Ser  Date Value Ref Range Status  01/04/2020 0.69 0.57 - 1.00 mg/dL Final         Passed - HBA1C is between 0 and 7.9 and within 180 days    Hemoglobin A1C  Date Value Ref Range Status  07/21/2020 6.1 (A) 4.0 - 5.6 % Final   Hgb A1c MFr Bld  Date Value Ref Range Status  10/02/2017 6.9 (H) 4.8 - 5.6 % Final    Comment:             Prediabetes: 5.7 - 6.4          Diabetes: >6.4          Glycemic control for adults with diabetes: <7.0          Passed - eGFR in normal range and within 360 days    EGFR (African American)  Date Value Ref Range Status  05/28/2014 >60 >62m/min Final   GFR calc Af Amer  Date Value Ref Range Status  01/04/2020 111 >59 mL/min/1.73 Final    Comment:    **[facility] currently reports eGFR in compliance with the current**   recommendations of the [employer] [facility] will   update reporting as new guidelines are published from the NKF-ASN   Task force.    EGFR (Non-African Amer.)  Date Value Ref Range Status  05/28/2014 >60 >637mmin Final    Comment:    eGFR values <6044min/1.73 m2 may be an indication of chronic kidney disease (CKD). Calculated eGFR, using the MRDR Study equation, is useful in  patients with stable renal function. The eGFR calculation will not be reliable in acutely ill patients when serum creatinine is changing rapidly. It is not useful in patients on dialysis. The eGFR calculation may not be applicable to patients at the low and high extremes of body sizes, pregnant women,  and vegetarians.  - [facility]  - READ-BACK PROCESS PERFORMED.    GFR calc non Af Amer  Date Value Ref Range Status  01/04/2020 96 >59 mL/min/1.73 Final         Passed - Valid encounter within last 6 months    Recent Outpatient Visits          [facility] week [facility]   4 months ago Need [facility] shingles vaccine   [facility]   6 months ago Essential [facility]primary) hypertension   [facility]   1 year [facility] Annual physical exam   [facility]   [facility]      Future Appointments            In 3 months [facility]

## 2020-08-26 ENCOUNTER — Other Ambulatory Visit: Payer: Self-pay | Admitting: Family Medicine

## 2020-09-23 ENCOUNTER — Other Ambulatory Visit: Payer: Self-pay | Admitting: Family Medicine

## 2020-10-10 ENCOUNTER — Other Ambulatory Visit: Payer: Self-pay | Admitting: Family Medicine

## 2020-10-10 DIAGNOSIS — J309 Allergic rhinitis, unspecified: Secondary | ICD-10-CM

## 2020-11-10 ENCOUNTER — Other Ambulatory Visit: Payer: Self-pay | Admitting: Family Medicine

## 2020-11-24 ENCOUNTER — Ambulatory Visit: Payer: Self-pay | Admitting: Family Medicine

## 2020-11-24 ENCOUNTER — Ambulatory Visit: Payer: Self-pay | Admitting: *Deleted

## 2020-11-24 DIAGNOSIS — U071 COVID-19: Secondary | ICD-10-CM

## 2020-11-24 MED ORDER — PAXLOVID 10 X 150 MG & 10 X 100MG PO TBPK: 2.0000 | ORAL_TABLET | Freq: Two times a day (BID) | ORAL | 0 refills | Status: DC

## 2020-11-24 NOTE — Telephone Encounter (Signed)
FYI

## 2020-11-24 NOTE — Addendum Note (Signed)
Addended by: Julieta Bellini on: 11/24/2020 05:51 PM   Modules accepted: Orders

## 2020-11-24 NOTE — Telephone Encounter (Signed)
Rx was sent to pharmacy. Patient was advised.

## 2020-11-24 NOTE — Telephone Encounter (Signed)
Patient tested positive for Covid with home test last pm. Symptoms began yesterday.Has had vaccine one and two. Symptoms include nasal and chest congestion, sneezing and fever yesterday, cough and fatigue. Denies any SOB at this time.  Reviewed isolation and health precautions, when to seek immediate treatment and OTC medications to help including mucinex and tylenol. Increase daily fluid intake during this time to prevent dehydration.  Next OV 12/12/20. Patient would like to be considered for Paxlovid. Pharmacy on file. Routing to physician.

## 2020-11-25 ENCOUNTER — Ambulatory Visit: Payer: Self-pay | Admitting: *Deleted

## 2020-11-25 ENCOUNTER — Other Ambulatory Visit: Payer: Self-pay | Admitting: Family Medicine

## 2020-11-25 DIAGNOSIS — E119 Type 2 diabetes mellitus without complications: Secondary | ICD-10-CM

## 2020-11-25 DIAGNOSIS — J309 Allergic rhinitis, unspecified: Secondary | ICD-10-CM

## 2020-11-25 DIAGNOSIS — U071 COVID-19: Secondary | ICD-10-CM

## 2020-11-25 MED ORDER — PAXLOVID 10 X 150 MG & 10 X 100MG PO TBPK: 2.0000 | ORAL_TABLET | Freq: Two times a day (BID) | ORAL | 0 refills | Status: AC

## 2020-11-25 NOTE — Addendum Note (Signed)
Addended by: Julieta Bellini on: 11/25/2020 02:29 PM   Modules accepted: Orders

## 2020-11-25 NOTE — Telephone Encounter (Signed)
Resent rx to pharmacy.

## 2020-11-25 NOTE — Telephone Encounter (Addendum)
Dr. Rosanna Randy called in [place] for pt yesterday for Covid.   Pt has called yesterday and to day and [facility] [address] [facility] [place] reporting that have not received the prescription.  I let pt know I would have someone call her back after checking with Dr. Rosanna Randy.   She was agreeable to this.   She can be reached at [phone].  I sent my notes to [facility] high priority as they are closed for lunch at the time of her call.

## 2020-11-25 NOTE — Telephone Encounter (Signed)
Reason for Disposition  [1] Caller has URGENT medicine question about med that PCP or specialist prescribed AND [2] triager unable to answer question    [facility] [address] [facility] [place] has not received the Paxlovid prescription  Answer Assessment - Initial Assessment Questions 1. NAME of MEDICATION: "What medicine are you calling about?"     Paxlovid   Did  2. QUESTION: "What is your question?" (e.g., double dose of medicine, side effect)     [facility] saying they did not receive the Rx for it.  [address] 3. PRESCRIBING HCP: "Who prescribed it?" Reason: if prescribed by specialist, call should be referred to that group.     Yesterday Dr. Rosanna Randy called it in   They still don't have the Rx 4. SYMPTOMS: "Do you have any symptoms?"     I have Covid and Dr. Rosanna Randy called it in for me. 5. SEVERITY: If symptoms are present, ask "Are they mild, moderate or severe?"     N/A 6. PREGNANCY:  "Is there any chance that you are pregnant?" "When was your last menstrual period?"     N/A  Protocols used: Medication Question Call-A-AH

## 2020-11-30 ENCOUNTER — Encounter: Payer: Self-pay | Admitting: Emergency Medicine

## 2020-11-30 ENCOUNTER — Emergency Department
Admission: EM | Admit: 2020-11-30 | Discharge: 2020-11-30 | Disposition: A | Discharge status: Home / Self Care | Payer: BC Managed Care – PPO | Attending: Emergency Medicine | Admitting: Emergency Medicine

## 2020-11-30 ENCOUNTER — Emergency Department: Payer: BC Managed Care – PPO

## 2020-11-30 ENCOUNTER — Other Ambulatory Visit: Payer: Self-pay

## 2020-11-30 ENCOUNTER — Ambulatory Visit: Payer: Self-pay | Admitting: *Deleted

## 2020-11-30 DIAGNOSIS — I10 Essential (primary) hypertension: Secondary | ICD-10-CM | POA: Insufficient documentation

## 2020-11-30 DIAGNOSIS — Z79899 Other long term (current) drug therapy: Secondary | ICD-10-CM | POA: Diagnosis not present

## 2020-11-30 DIAGNOSIS — U071 COVID-19: Secondary | ICD-10-CM | POA: Diagnosis not present

## 2020-11-30 DIAGNOSIS — R0789 Other chest pain: Secondary | ICD-10-CM | POA: Diagnosis not present

## 2020-11-30 DIAGNOSIS — R0602 Shortness of breath: Secondary | ICD-10-CM | POA: Diagnosis not present

## 2020-11-30 NOTE — ED Provider Notes (Signed)
[facility] Emergency Department Provider Note   ____________________________________________    I have reviewed the triage vital signs and the nursing notes.   HISTORY  Chief Complaint Shortness of Breath     HPI Cheryl Yang is a 60 y.o. female with history as noted below presents with complaints of chest discomfort over the last 6 to 7 days.  Patient reports 6 days ago she was diagnosed with COVID-19, she has been vaccinated.  Since then she has had discomfort in her chest.  She has had cough congestion and fatigue.  She did take paxlovid and finish the course.  She reports continued discomfort in her chest but continues to have COVID symptoms.  Referred by physicians office to the ED  Past Medical History:  Diagnosis Date   Anemia    due to fibroids (has had hysterectomy)   Anxiety    Arthritis    Depression    Eczema    Environmental and seasonal allergies    GERD (gastroesophageal reflux disease)    occ, uses Prilosec as needed with anti-imflammatories   Hypertension    PONV (postoperative nausea and vomiting)     Patient Active Problem List   Diagnosis Date Noted   Back pain, chronic 11/06/2014   Essential (primary) hypertension 11/06/2014   Fibrositis 11/06/2014   Borderline diabetes 11/06/2014   Depression, major, recurrent, mild ([place]) 11/06/2014   Adiposity 11/06/2014   HLD (hyperlipidemia) 11/06/2014   Allergic rhinitis, seasonal 11/06/2014   Snores 11/06/2014   Submucous leiomyoma of uterus 11/06/2014   Lumbar spinal stenosis 07/20/2014    Past Surgical History:  Procedure Laterality Date   BACK SURGERY     lumbar surgery   COLONOSCOPY     EYE SURGERY Left    had hole in eye repaired   VAGINAL HYSTERECTOMY      Prior to Admission medications   Medication Sig Start Date End Date Taking? Authorizing Provider  chlorpheniramine-HYDROcodone (TUSSIONEX PENNKINETIC ER) 10-8 MG/5ML SUER Take 5 mLs by mouth 2 (two) times  daily. Patient not taking: No sig reported 06/23/18   Jerrol Banana., MD  clotrimazole-betamethasone (LOTRISONE) cream Apply 1 application topically 2 (two) times daily. 10/02/17   Jerrol Banana., MD  Coenzyme Q10 (CO Q10) 100 MG CAPS Take 200 mg by mouth daily.    [provider]  fexofenadine (ALLEGRA) 180 MG tablet Take 180 mg by mouth daily.    [provider]  gabapentin (NEURONTIN) 300 MG capsule Take 2 capsules (600 mg total) by mouth 2 (two) times daily. 3 tablets daily 06/09/19   Jerrol Banana., MD  glucose blood test strip Check sugar once daily DX E11.9, patient has one touch ultra mini and needs strips and lancets for this 04/13/16   Jerrol Banana., MD  HYDROcodone-acetaminophen (NORCO/VICODIN) 5-325 MG tablet Take 1 tablet by mouth every 6 (six) hours as needed for moderate pain. Patient not taking: No sig reported 10/09/17   Jerrol Banana., MD  metFORMIN (GLUCOPHAGE) 500 MG tablet TAKE 1 TABLET BY MOUTH EVERY DAY WITH BREAKFAST 07/28/20   Jerrol Banana., MD  montelukast (SINGULAIR) 10 MG tablet TAKE 1 TABLET BY MOUTH EVERYDAY AT BEDTIME 11/25/20   Jerrol Banana., MD  naproxen sodium (ALEVE) 220 MG tablet Take 220 mg by mouth.    [provider]  nirmatrelvir/ritonavir EUA, renal dosing, (PAXLOVID) TBPK Take 2 tablets by mouth 2 (two) times daily  for 5 days. Patient GFR is 96. Take nirmatrelvir (150 mg) one tablet twice daily for 5 days and ritonavir (100 mg) one tablet twice daily for 5 days. 11/25/20 11/30/20  Jerrol Banana., MD  Omega-3 Fatty Acids (OMEGA 3 PO) Take by mouth.    [provider]  omeprazole (PRILOSEC) 20 MG capsule Take 1 capsule (20 mg total) by mouth daily as needed (for indigestion). Patient not taking: No sig reported 06/07/15   Jerrol Banana., MD  rosuvastatin (CRESTOR) 5 MG tablet TAKE 1 TABLET BY MOUTH EVERYDAY AT BEDTIME 11/25/20   Jerrol Banana., MD   sertraline (ZOLOFT) 100 MG tablet TAKE 1 TABLET BY MOUTH EVERY DAY 11/10/20   Jerrol Banana., MD  telmisartan-hydrochlorothiazide (MICARDIS HCT) 40-12.5 MG tablet TAKE 1 TABLET BY MOUTH EVERY DAY 08/26/20   Jerrol Banana., MD  triamcinolone (NASACORT) 55 MCG/ACT AERO nasal inhaler Place into the nose daily.    [provider]  VITAMIN D PO Take 1,000 Units by mouth daily at 12 noon.    [provider]     Allergies Hyman Hopes allergy], Seldane [terfenadine], and Tagamet [cimetidine]  Family History  Problem Relation Age of Onset   Heart attack Mother    CVA Mother    Restless legs syndrome Mother    Breast cancer Neg Hx     Social History Social History   Tobacco Use   Smoking status: Never   Smokeless tobacco: Never  Substance Use Topics   Alcohol use: Yes    Alcohol/week: 0.0 standard drinks    Comment: very rare   Drug use: No    Review of Systems  Constitutional: Fatigue Eyes: No visual changes.  ENT: As above Cardiovascular: As above Respiratory: Denies shortness of breath. Gastrointestinal: No abdominal pain.  No nausea, no vomiting.   Genitourinary: Negative for dysuria. Musculoskeletal: Negative for back pain. Skin: Negative for rash. Neurological: Negative for headaches or weakness   ____________________________________________   PHYSICAL EXAM:  VITAL SIGNS: ED Triage Vitals  Enc Vitals Group     BP 11/30/20 1200 120/88     Pulse Rate 11/30/20 1200 69     Resp 11/30/20 1200 20     Temp 11/30/20 1200 98.7 F (37.1 C)     Temp Source 11/30/20 1200 Oral     SpO2 11/30/20 1200 95 %     Weight 11/30/20 1150 111 kg (244 lb 11.4 oz)     Height 11/30/20 1150 1.651 m (5\' 5" )     Head Circumference --      Peak Flow --      Pain Score 11/30/20 1150 0     Pain Loc --      Pain Edu? --      Excl. in [place]? --     Constitutional: Alert and oriented. No acute distress.   Nose: No congestion/rhinnorhea. Mouth/Throat:  Mucous membranes are moist.    Cardiovascular: Normal rate, regular rhythm. Grossly normal heart sounds.  Good peripheral circulation. Respiratory: Normal respiratory effort.  No retractions. Lungs CTAB.   Musculoskeletal: No lower extremity tenderness nor edema.  Warm and well perfused Neurologic:  Normal speech and language. No gross focal neurologic deficits are appreciated.  Skin:  Skin is warm, dry and intact. No rash noted. Psychiatric: Mood and affect are normal. Speech and behavior are normal.  ____________________________________________   LABS (all labs ordered are listed, but only abnormal results are displayed)  Labs Reviewed -  No data to display ____________________________________________  EKG ED ECG REPORT I, Lavonia Drafts, the attending physician, personally viewed and interpreted this ECG.  Date: 11/30/2020  Rhythm: normal sinus rhythm QRS Axis: normal Intervals: normal ST/T Wave abnormalities: Nonspecific changes Narrative Interpretation: no evidence of acute ischemia  ____________________________________________  RADIOLOGY  Chest x-ray reviewed by me, no pneumonia ____________________________________________   PROCEDURES  Procedure(s) performed: No  Procedures   Critical Care performed: No ____________________________________________   INITIAL IMPRESSION / ASSESSMENT AND PLAN / ED COURSE  Pertinent labs & imaging results that were available during my care of the patient were reviewed by me and considered in my medical decision making (see chart for details).   Patient well-appearing and in no acute distress, vital signs and exam are reassuring.  She has had COVID for 6 days now and has had continuous chest discomfort during this time.  This is most likely related to COVID-19 as she has no evidence of pneumonia, and her EKG is quite reassuring.  No pleurisy or shortness of breath to suggest PE and time course would be very atypical,  appropriate for discharge with supportive care, outpatient follow-up    ____________________________________________   FINAL CLINICAL IMPRESSION(S) / ED DIAGNOSES  Final diagnoses:  COVID-19        Note:  This document was prepared using Dragon voice recognition software and may include unintentional dictation errors.    Lavonia Drafts, MD 11/30/20 [phone]

## 2020-11-30 NOTE — Telephone Encounter (Signed)
Reason for Disposition  Coughing up blood    Covid positive.  Chest heaviness, coughing up green and bloody sputum  O2 sat 95-96% on room air.  Answer Assessment - Initial Assessment Questions 1. LOCATION: "Where does it hurt?"       She is covid positive   Chest is heavy feeling.   I took my last Paxlovid today.   I'm still coughing some and sneezing and have a runny nose.   There is green and bloody mucus in my nose secretions and coughing it up.   The coughing is better.   Positive test on November 23, 2020.   I've had the heavy chest feeling since having covid.   It's not getting better.  2. RADIATION: "Does the pain go anywhere else?" (e.g., into neck, jaw, arms, back)     No.   O2 95%.   Pulse 88.   No shortness of breath.   It's just heavy feeling when I take a deep breath.   I sitting up during the day.   I'm working from home. 3. ONSET: "When did the chest pain begin?" (Minutes, hours or days)      Since beginning of covid.   No fever today.   I had chills a couple of times yesterday.   I'm also taking Mucinix with Tylenol in it.   4. PATTERN "Does the pain come and go, or has it been constant since it started?"  "Does it get worse with exertion?"      It's different heaviness.   5. DURATION: "How long does it last" (e.g., seconds, minutes, hours)     Constant   6. SEVERITY: "How bad is the pain?"  (e.g., Scale 1-10; mild, moderate, or severe)    - MILD (1-3): doesn't interfere with normal activities     - MODERATE (4-7): interferes with normal activities or awakens from sleep    - SEVERE (8-10): excruciating pain, unable to do any normal activities       It's a pressure that feels different now in my chest. 7. CARDIAC RISK FACTORS: "Do you have any history of heart problems or risk factors for heart disease?" (e.g., angina, prior heart attack; diabetes, high blood pressure, high cholesterol, smoker, or strong family history of heart disease)     No cardiac history 8. PULMONARY RISK  FACTORS: "Do you have any history of lung disease?"  (e.g., blood clots in lung, asthma, emphysema, birth control pills)     Denies any lung issues.    She did check her Ox level with her pulse ox and it's 95-96% on room air.   She does not smoke or have any lung issues.   No history of pneumonia. 9. CAUSE: "What do you think is causing the chest pain?"     Covid.   I've had the heaviness since I've been sick but it is worse and feels different.   I'm coughing up green stuff with blood in it.   Also the same is coming out when I blow my nose.   I'm taking Mucinix and on the last day of Paxlovid. 10. OTHER SYMPTOMS: "Do you have any other symptoms?" (e.g., dizziness, nausea, vomiting, sweating, fever, difficulty breathing, cough)       Denies shortness of breath just the heaviness in her chest that feels different.   Still coughing, had chills yesterday.   No fever today that she can tell.   See above 11. PREGNANCY: "Is there any chance you  are pregnant?" "When was your last menstrual period?"       Not asked due to age  Protocols used: Chest Pain-A-AH

## 2020-11-30 NOTE — ED Triage Notes (Signed)
Tested positive for COVID on 7/13.  Arrives today because symptoms of chest heaviness and SOB have not improved.  Patient is AAOx3.  Skin warm and dry. No SOB/ DOE  NAD

## 2020-11-30 NOTE — ED Notes (Signed)
ED Provider at bedside. 

## 2020-11-30 NOTE — ED Notes (Addendum)
See triage note. Patient reports shortness of breath and chest tightness. Patient reports COVID + x 1 week. Patient reports completing Paxlovid. Patient reports mucous from nose - green in color.

## 2020-11-30 NOTE — Telephone Encounter (Signed)
Pt called in c/o chest heaviness that is worse and feels different.  She is positive for Covid since testing on November 23, 2020.   She is coughing up and blowing from her nose green mucus with blood in it.  She denies shortness of breath just "heavy in my chest".   Her pulse ox is 95-96% on room air.   Does not smoke or have issues with her lungs.  See triage notes  I have referred her to the ED per the protocol.   She is going to have her husband take her to [facility].   Let her know I would inform Dr. Rosanna Randy.  I am sending my notes to [facility] for Dr. Miguel Aschoff.

## 2020-12-07 LAB — HEMOGLOBIN A1C: Hemoglobin A1C: 6.3

## 2020-12-08 ENCOUNTER — Other Ambulatory Visit: Payer: Self-pay | Admitting: Family Medicine

## 2020-12-08 DIAGNOSIS — G8929 Other chronic pain: Secondary | ICD-10-CM

## 2020-12-08 DIAGNOSIS — M549 Dorsalgia, unspecified: Secondary | ICD-10-CM

## 2020-12-08 NOTE — Telephone Encounter (Signed)
  Notes to clinic:  script requested has expired  Review for continued use and refill    Requested Prescriptions  Pending Prescriptions Disp Refills   gabapentin (NEURONTIN) 300 MG capsule [Pharmacy Med Name: GABAPENTIN 300 MG CAPSULE] 360 capsule 3    Sig: Take 2 capsules (600 mg total) by mouth 2 (two) times daily. 3 tablets daily      Neurology: Anticonvulsants - gabapentin Passed - 12/08/2020 10:52 AM      Passed - Valid encounter within last 12 months    Recent Outpatient Visits           4 months ago Annual physical exam   [facility] Jerrol Banana., MD   9 months ago Need for shingles vaccine   [facility] Jerrol Banana., MD   11 months ago Essential (primary) hypertension   [facility] Jerrol Banana., MD   1 year ago Annual physical exam   [facility] Jerrol Banana., MD   1 year ago Diabetes mellitus without complication [facility])   [facility] Jerrol Banana., MD       Future Appointments             In 2 weeks Jerrol Banana., MD [facility], [place]

## 2020-12-08 NOTE — Telephone Encounter (Signed)
Pt called and reported that she is completely out of her current supply. Please advise

## 2020-12-08 NOTE — Telephone Encounter (Signed)
   Notes to clinic:  Patient is currently out and has upcoming appt   Requested Prescriptions  Pending Prescriptions Disp Refills   gabapentin (NEURONTIN) 300 MG capsule [Pharmacy Med Name: GABAPENTIN 300 MG CAPSULE] 360 capsule 3    Sig: Take 2 capsules (600 mg total) by mouth 2 (two) times daily. 3 tablets daily      Neurology: Anticonvulsants - gabapentin Passed - 12/08/2020 11:08 AM      Passed - Valid encounter within last 12 months    Recent Outpatient Visits           4 months ago Annual physical exam   [facility] Jerrol Banana., MD   9 months ago Need for shingles vaccine   [facility] Jerrol Banana., MD   11 months ago Essential (primary) hypertension   [facility] Jerrol Banana., MD   1 year ago Annual physical exam   [facility] Jerrol Banana., MD   1 year ago Diabetes mellitus without complication [facility])   [facility] Jerrol Banana., MD       Future Appointments             In 2 weeks Jerrol Banana., MD [facility], [place]

## 2020-12-12 ENCOUNTER — Ambulatory Visit: Payer: Self-pay | Admitting: Family Medicine

## 2020-12-26 ENCOUNTER — Other Ambulatory Visit: Payer: Self-pay

## 2020-12-26 ENCOUNTER — Ambulatory Visit: Payer: BC Managed Care – PPO | Admitting: Family Medicine

## 2020-12-26 ENCOUNTER — Encounter: Payer: Self-pay | Admitting: Family Medicine

## 2020-12-26 VITALS — BP 123/76 | HR 72 | Resp 16 | Ht 65.0 in | Wt 254.0 lb

## 2020-12-26 DIAGNOSIS — U071 COVID-19: Secondary | ICD-10-CM

## 2020-12-26 DIAGNOSIS — I10 Essential (primary) hypertension: Secondary | ICD-10-CM

## 2020-12-26 DIAGNOSIS — E782 Mixed hyperlipidemia: Secondary | ICD-10-CM

## 2020-12-26 DIAGNOSIS — E119 Type 2 diabetes mellitus without complications: Secondary | ICD-10-CM | POA: Diagnosis not present

## 2020-12-26 DIAGNOSIS — J302 Other seasonal allergic rhinitis: Secondary | ICD-10-CM | POA: Diagnosis not present

## 2020-12-26 DIAGNOSIS — Z6841 Body Mass Index (BMI) 40.0 and over, adult: Secondary | ICD-10-CM

## 2020-12-26 NOTE — Patient Instructions (Signed)
Take Mucinex twice a day for congestion.

## 2020-12-26 NOTE — Progress Notes (Signed)
I,[name],acting as a scribe for [name], MD.,have documented all relevant documentation on the behalf of [name], MD,as directed by  [name], MD while in the presence of [name], MD.   Established patient visit   Patient: Cheryl Yang   DOB: [birth year]   60 y.o. Female  MRN: [id number] Visit Date: 12/26/2020  Today's healthcare provider: [name], MD   Chief Complaint  Patient presents with   Follow-up   Diabetes   Hypertension   Subjective  ------------------------------------------------------------------------------------------------------------- HPI  Patient has had 3 COVID vaccines and had COVID 3 weeks ago and now continues to have a lot of chest congestion and some postnasal drainage.  No shortness of breath fevers or any problems otherwise.  Her son just got married and she is excited about this as they have had an 11-year relationship. Diabetes Mellitus Type II, follow-up  Lab Results  Component Value Date   HGBA1C 6.3 12/07/2020   HGBA1C 6.1 (A) 07/21/2020   HGBA1C 6.3 (A) 01/04/2020   Last seen for diabetes 5 months ago.  Management since then includes; Well-controlled with A1c of 6.1 today.  An exercise is stressed. She reports good compliance with treatment. She is not having side effects. none  Home blood sugar records: fasting range: 95-120  Episodes of hypoglycemia? No none   Current insulin regiment: n/a Most Recent Eye Exam: 11/21  --------------------------------------------------------------------------------------------------- Hypertension, follow-up  BP Readings from Last 3 Encounters:  12/26/20 123/76  11/30/20 120/88  07/21/20 127/80   Wt Readings from Last 3 Encounters:  12/26/20 254 lb (115.2 kg)  11/30/20 240 lb (108.9 kg)  07/21/20 245 lb (111.1 kg)     She was last seen for hypertension 5 months ago.  BP at that visit was 127/80. Management since that visit includes;  telmisartan-hydrochlorothiazide She reports good compliance with treatment. She is not having side effects. none She is exercising. She is adherent to low salt diet.   Outside blood pressures are 115/70.  She does not smoke.  Use of agents associated with hypertension: none.   ---------------------------------------------------------------------------------------------------       Medications: Outpatient Medications Prior to Visit  Medication Sig   clotrimazole-betamethasone (LOTRISONE) cream Apply 1 application topically 2 (two) times daily.   Coenzyme Q10 (CO Q10) 100 MG CAPS Take 200 mg by mouth daily.   fexofenadine (ALLEGRA) 180 MG tablet Take 180 mg by mouth daily.   gabapentin (NEURONTIN) 300 MG capsule TAKE 2 CAPSULES (600 MG TOTAL) BY MOUTH 2 (TWO) TIMES DAILY. 3 TABLETS DAILY   glucose blood test strip Check sugar once daily DX E11.9, patient has one touch ultra mini and needs strips and lancets for this   metFORMIN (GLUCOPHAGE) 500 MG tablet TAKE 1 TABLET BY MOUTH EVERY DAY WITH BREAKFAST   montelukast (SINGULAIR) 10 MG tablet TAKE 1 TABLET BY MOUTH EVERYDAY AT BEDTIME   naproxen sodium (ALEVE) 220 MG tablet Take 220 mg by mouth.   Omega-3 Fatty Acids (OMEGA 3 PO) Take by mouth.   rosuvastatin (CRESTOR) 5 MG tablet TAKE 1 TABLET BY MOUTH EVERYDAY AT BEDTIME   sertraline (ZOLOFT) 100 MG tablet TAKE 1 TABLET BY MOUTH EVERY DAY   telmisartan-hydrochlorothiazide (MICARDIS HCT) 40-12.5 MG tablet TAKE 1 TABLET BY MOUTH EVERY DAY   triamcinolone (NASACORT) 55 MCG/ACT AERO nasal inhaler Place into the nose daily.   VITAMIN D PO Take 1,000 Units by mouth daily at 12 noon.   [DISCONTINUED] chlorpheniramine-HYDROcodone (TUSSIONEX  PENNKINETIC ER) 10-8 MG/5ML SUER Take 5 mLs by mouth 2 (two) times daily. (Patient not taking: No sig reported)   [DISCONTINUED] HYDROcodone-acetaminophen (NORCO/VICODIN) 5-325 MG tablet Take 1 tablet by mouth every 6 (six) hours as needed for moderate  pain. (Patient not taking: No sig reported)   [DISCONTINUED] omeprazole (PRILOSEC) 20 MG capsule Take 1 capsule (20 mg total) by mouth daily as needed (for indigestion). (Patient not taking: No sig reported)   No facility-administered medications prior to visit.    Review of Systems  Constitutional:  Negative for appetite change, chills, fatigue and fever.  Respiratory:  Negative for chest tightness and shortness of breath.   Cardiovascular:  Negative for chest pain and palpitations.  Gastrointestinal:  Negative for abdominal pain, nausea and vomiting.  Neurological:  Negative for dizziness and weakness.   Last hemoglobin A1c Lab Results  Component Value Date   HGBA1C 6.3 12/07/2020       Objective  -------------------------------------------------------------------------------------------------------------------- BP 123/76 (BP Location: Left Arm, Patient Position: Sitting, Cuff Size: Large)   Pulse 72   Resp 16   Ht '5\' 5"'$  (1.651 m)   Wt 254 lb (115.2 kg)   SpO2 91%   BMI 42.27 kg/m  BP Readings from Last 3 Encounters:  12/26/20 123/76  11/30/20 120/88  07/21/20 127/80   Wt Readings from Last 3 Encounters:  12/26/20 254 lb (115.2 kg)  11/30/20 240 lb (108.9 kg)  07/21/20 245 lb (111.1 kg)       Physical Exam Vitals reviewed.  Constitutional:      Appearance: She is well-developed.  HENT:     Head: Normocephalic and atraumatic.  Eyes:     General: No scleral icterus.    Conjunctiva/sclera: Conjunctivae normal.  Neck:     Thyroid: No thyromegaly.  Cardiovascular:     Rate and Rhythm: Normal rate and regular rhythm.     Heart sounds: Normal heart sounds.  Pulmonary:     Effort: Pulmonary effort is normal.     Breath sounds: Normal breath sounds.  Abdominal:     Palpations: Abdomen is soft.  Musculoskeletal:     Comments: sligh hammertoe of right 2nd toe.  Skin:    General: Skin is warm and dry.  Neurological:     Mental Status: She is alert and oriented  to person, place, and time.     Comments: Monofilament exam normal.  Psychiatric:        Mood and Affect: Mood normal.        Behavior: Behavior normal.        Thought Content: Thought content normal.        Judgment: Judgment normal.      Results for orders placed or performed in visit on 12/26/20  Hemoglobin A1c  Result Value Ref Range   Hemoglobin A1C 6.3     Assessment & Plan  ----------------------------------------------------------------------------------------------------------------------  1. COVID-19 Patient is having some post COVID symptoms but these are mild.  No further intervention other than Robitussin twice a day for congestion  2. Diabetes mellitus without complication ([place]) Continue to work on diet and exercise.  Last A1c 2 weeks ago was 6.3 which is rising.  We need to up  metformin.  3. Essential (primary) hypertension Controlled on telmisartan HCT  4. Seasonal allergic rhinitis, unspecified trigger On montelukast and Allegra  5. Class 3 severe obesity due to excess calories with serious comorbidity and body mass index (BMI) of 40.0 to 44.9 in adult [facility]) Diet and exercise stressed.  6. Mixed hyperlipidemia On rosuvastatin 5.  Return in about 10 months (around 10/12/2021).      I, [name], MD, have reviewed all documentation for this visit. The documentation on 01/01/21 for the exam, diagnosis, procedures, and orders are all accurate and complete.    Bralen Wiltgen Cranford Mon, MD  [facility] [phone] (phone) [phone] (fax)  [place]

## 2021-01-07 ENCOUNTER — Other Ambulatory Visit: Payer: Self-pay | Admitting: Family Medicine

## 2021-01-07 DIAGNOSIS — E119 Type 2 diabetes mellitus without complications: Secondary | ICD-10-CM

## 2021-01-07 NOTE — Telephone Encounter (Signed)
telmisartan- HCTZ:last RF 08/26/20 #90 1 RF  Metformin: 07/28/20 #90 1 RF

## 2021-03-09 ENCOUNTER — Other Ambulatory Visit: Payer: Self-pay | Admitting: Family Medicine

## 2021-03-09 DIAGNOSIS — E119 Type 2 diabetes mellitus without complications: Secondary | ICD-10-CM

## 2021-03-10 NOTE — Telephone Encounter (Signed)
Requested medications are due for refill today yes  Requested medications are on the active medication list yes  Last refill 10/25/20  Last visit 12/26/20, labs ordered but not drawn  Future visit scheduled 10/12/21  Notes to clinic Lipid, CBC, CMET, and B12 ordered 01/04/20 but not drawn, failed protocol of labs within 360 days.  Requested Prescriptions  Pending Prescriptions Disp Refills   metFORMIN (GLUCOPHAGE) 500 MG tablet [Pharmacy Med Name: METFORMIN HCL 500 MG TABLET] 90 tablet 1    Sig: TAKE 1 TABLET BY MOUTH [place]     Endocrinology:  Diabetes - Biguanides Failed - 03/09/2021  7:30 PM      Failed - Cr in normal range and within 360 days    Creatinine  Date Value Ref Range Status  05/28/2014 0.97 0.60 - 1.30 mg/dL Final   Creatinine, Ser  Date Value Ref Range Status  01/04/2020 0.69 0.57 - 1.00 mg/dL Final          Failed - eGFR in normal range and within 360 days    EGFR (African American)  Date Value Ref Range Status  05/28/2014 >60 >17m/min Final   GFR calc Af Amer  Date Value Ref Range Status  01/04/2020 111 >59 mL/min/1.73 Final    Comment:    **[facility] currently reports eGFR in compliance with the current**   recommendations of the [employer] [facility] will   update reporting as new guidelines are published from the NKF-ASN   Task force.    EGFR (Non-African Amer.)  Date Value Ref Range Status  05/28/2014 >60 >631mmin Final    Comment:    eGFR values <6037min/1.73 m2 may be an indication of chronic kidney disease (CKD). Calculated eGFR, using the MRDR Study equation, is useful in  patients with stable renal function. The eGFR calculation will not be reliable in acutely ill patients when serum creatinine is changing rapidly. It is not useful in patients on dialysis. The eGFR calculation may not be applicable to patients at the low and high extremes of body sizes, pregnant women, and vegetarians.  - [facility]  - READ-BACK PROCESS PERFORMED.    GFR calc non Af Amer  Date Value Ref Range Status  01/04/2020 96 >59 mL/min/1.73 Final          Passed - HBA1C is between 0 and 7.9 and within 180 days    Hemoglobin A1C  Date Value Ref Range Status  12/07/2020 6.3  Final          Passed - Valid encounter within last 6 months    Recent Outpatient Visits           2 months ago [facility]   7 months ago Annual physical exam   [facility]   1 year ago Need for shingles vaccine   [facility]   1 year ago Essential (primary) hypertension   [facility]   1 year ago Annual physical exam   [facility]       Future Appointments             In 7 months [facility]Jerrol Ban[facility]

## 2021-03-17 ENCOUNTER — Other Ambulatory Visit: Payer: Self-pay | Admitting: Family Medicine

## 2021-03-17 DIAGNOSIS — J309 Allergic rhinitis, unspecified: Secondary | ICD-10-CM

## 2021-03-17 NOTE — Telephone Encounter (Signed)
Requested Prescriptions  Pending Prescriptions Disp Refills  . montelukast (SINGULAIR) 10 MG tablet [Pharmacy Med Name: MONTELUKAST SOD 10 MG TABLET] 90 tablet 2    Sig: TAKE 1 TABLET BY MOUTH EVERYDAY AT BEDTIME     Pulmonology:  Leukotriene Inhibitors Passed - 03/17/2021  1:38 AM      Passed - Valid encounter within last 12 months    Recent Outpatient Visits          2 months ago [place] Rosanna Randy, Retia Passe., MD   7 months ago Annual physical exam   [facility] Jerrol Banana., MD   1 year ago Need for shingles vaccine   [facility] Jerrol Banana., MD   1 year ago Essential (primary) hypertension   [facility] Jerrol Banana., MD   1 year ago Annual physical exam   [facility] Jerrol Banana., MD      Future Appointments            In 6 months Jerrol Banana., MD [facility], [place]

## 2021-04-09 ENCOUNTER — Other Ambulatory Visit: Payer: Self-pay | Admitting: Family Medicine

## 2021-04-10 MED ORDER — ROSUVASTATIN CALCIUM 5 MG PO TABS: 5.0000 mg | ORAL_TABLET | Freq: Every day | ORAL | 1 refills | Status: DC

## 2021-05-13 ENCOUNTER — Other Ambulatory Visit: Payer: Self-pay | Admitting: Family Medicine

## 2021-05-13 NOTE — Telephone Encounter (Signed)
Requested Prescriptions  Pending Prescriptions Disp Refills   sertraline (ZOLOFT) 100 MG tablet [Pharmacy Med Name: SERTRALINE HCL 100 MG TABLET] 90 tablet 1    Sig: TAKE 1 TABLET BY MOUTH EVERY DAY     Psychiatry:  Antidepressants - SSRI Passed - 05/13/2021  9:12 AM      Passed - Completed PHQ-2 or PHQ-9 in the last 360 days      Passed - Valid encounter within last 6 months    Recent Outpatient Visits          4 months ago [place] Jerrol Banana., MD   9 months ago Annual physical exam   [facility] Jerrol Banana., MD   1 year ago Need for shingles vaccine   [facility] Jerrol Banana., MD   1 year ago Essential (primary) hypertension   [facility] Jerrol Banana., MD   1 year ago Annual physical exam   [facility] Jerrol Banana., MD      Future Appointments            In 5 months Jerrol Banana., MD [facility], [place]

## 2021-06-07 ENCOUNTER — Other Ambulatory Visit: Payer: Self-pay | Admitting: Family Medicine

## 2021-06-07 NOTE — Telephone Encounter (Signed)
Requested medication (s) are due for refill today: yes, not filled since July  Requested medication (s) are on the active medication list: yes  Last refill:  08/26/20 #90 with 1 RF  Future visit scheduled: 10/12/21  Notes to clinic:  Failed protocol of labs within 180 days, (labs from 01/04/2020)  has upcoming appt, please assess.   Requested Prescriptions  Pending Prescriptions Disp Refills   telmisartan-hydrochlorothiazide (MICARDIS HCT) 40-12.5 MG tablet [Pharmacy Med Name: TELMISARTAN-HCTZ 40-12.5 MG TB] 90 tablet 1    Sig: TAKE 1 TABLET BY MOUTH EVERY DAY     Cardiovascular: ARB + Diuretic Combos Failed - 06/07/2021  7:05 PM      Failed - K in normal range and within 180 days    Potassium  Date Value Ref Range Status  01/04/2020 3.6 3.5 - 5.2 mmol/L Final          Failed - Na in normal range and within 180 days    Sodium  Date Value Ref Range Status  01/04/2020 140 134 - 144 mmol/L Final          Failed - Cr in normal range and within 180 days    Creatinine  Date Value Ref Range Status  05/28/2014 0.97 0.60 - 1.30 mg/dL Final   Creatinine, Ser  Date Value Ref Range Status  01/04/2020 0.69 0.57 - 1.00 mg/dL Final          Failed - Ca in normal range and within 180 days    Calcium  Date Value Ref Range Status  01/04/2020 9.5 8.7 - 10.2 mg/dL Final          Passed - Patient is not pregnant      Passed - Last BP in normal range    BP Readings from Last 1 Encounters:  12/26/20 123/76          Passed - Valid encounter within last 6 months    Recent Outpatient Visits           5 months ago [place] Jerrol Banana., MD   10 months ago Annual physical exam   [facility] Jerrol Banana., MD   1 year ago Need for shingles vaccine   [facility] Jerrol Banana., MD   1 year ago Essential (primary) hypertension   [facility] Jerrol Banana., MD   1 year ago  Annual physical exam   [facility] Jerrol Banana., MD       Future Appointments             In 4 months Jerrol Banana., MD [facility], [place]

## 2021-06-08 ENCOUNTER — Other Ambulatory Visit: Payer: Self-pay | Admitting: Family Medicine

## 2021-06-08 MED ORDER — TELMISARTAN-HCTZ 40-12.5 MG PO TABS: 1.0000 | ORAL_TABLET | Freq: Every day | ORAL | 0 refills | Status: DC

## 2021-09-29 ENCOUNTER — Other Ambulatory Visit: Payer: Self-pay | Admitting: Family Medicine

## 2021-10-05 ENCOUNTER — Other Ambulatory Visit: Payer: Self-pay | Admitting: Physician Assistant

## 2021-10-05 ENCOUNTER — Other Ambulatory Visit: Payer: Self-pay | Admitting: Family Medicine

## 2021-10-05 DIAGNOSIS — J309 Allergic rhinitis, unspecified: Secondary | ICD-10-CM

## 2021-10-05 DIAGNOSIS — G8929 Other chronic pain: Secondary | ICD-10-CM

## 2021-10-05 DIAGNOSIS — E119 Type 2 diabetes mellitus without complications: Secondary | ICD-10-CM

## 2021-10-06 NOTE — Telephone Encounter (Signed)
Requested Prescriptions  Pending Prescriptions Disp Refills  . telmisartan-hydrochlorothiazide (MICARDIS HCT) 40-12.5 MG tablet [Pharmacy Med Name: TELMISARTAN-HCTZ 40-12.5 MG TB] 90 tablet 0    Sig: TAKE 1 TABLET BY MOUTH EVERY DAY     Cardiovascular: ARB + Diuretic Combos Failed - 10/05/2021  9:34 AM      Failed - K in normal range and within 180 days    Potassium  Date Value Ref Range Status  01/04/2020 3.6 3.5 - 5.2 mmol/L Final         Failed - Na in normal range and within 180 days    Sodium  Date Value Ref Range Status  01/04/2020 140 134 - 144 mmol/L Final         Failed - Cr in normal range and within 180 days    Creatinine  Date Value Ref Range Status  05/28/2014 0.97 0.60 - 1.30 mg/dL Final   Creatinine, Ser  Date Value Ref Range Status  01/04/2020 0.69 0.57 - 1.00 mg/dL Final         Failed - eGFR is 10 or above and within 180 days    EGFR (African American)  Date Value Ref Range Status  05/28/2014 >60 >37m/min Final   GFR calc Af Amer  Date Value Ref Range Status  01/04/2020 111 >59 mL/min/1.73 Final    Comment:    **[facility] currently reports eGFR in compliance with the current**   recommendations of the [employer] [facility] will   update reporting as new guidelines are published from the NKF-ASN   Task force.    EGFR (Non-African Amer.)  Date Value Ref Range Status  05/28/2014 >60 >638mmin Final    Comment:    eGFR values <6027min/1.73 m2 may be an indication of chronic kidney disease (CKD). Calculated eGFR, using the MRDR Study equation, is useful in  patients with stable renal function. The eGFR calculation will not be reliable in acutely ill patients when serum creatinine is changing rapidly. It is not useful in patients on dialysis. The eGFR calculation may not be applicable to patients at the low and high extremes of body sizes, pregnant women, and vegetarians.  - [facility]  - READ-BACK PROCESS  PERFORMED.    GFR calc non Af Amer  Date Value Ref Range Status  01/04/2020 96 >59 mL/min/1.73 Final         Failed - Valid encounter within last 6 months    Recent Outpatient Visits          9 months ago [facility] Passe[facility]   1 year ago Annual physical exam   [facility]   1 year ago Need for shingles vaccine   [facility]   1 year ago Essential (primary) hypertension   [facility]   2 years ago Annual physical exam   [facility]      Future Appointments            In 2 weeks GilJerrol BananaMD [facility] [place]Patient is not pregnant      Passed - Last BP in normal range    BP Readings from Last 1 Encounters:  12/26/20 123/76

## 2021-10-12 ENCOUNTER — Encounter: Payer: BC Managed Care – PPO | Admitting: Family Medicine

## 2021-10-23 ENCOUNTER — Encounter: Payer: Self-pay | Admitting: Family Medicine

## 2021-10-23 ENCOUNTER — Ambulatory Visit (INDEPENDENT_AMBULATORY_CARE_PROVIDER_SITE_OTHER): Payer: BC Managed Care – PPO | Admitting: Family Medicine

## 2021-10-23 VITALS — BP 127/58 | HR 80 | Resp 16 | Ht 65.0 in | Wt 240.0 lb

## 2021-10-23 DIAGNOSIS — E119 Type 2 diabetes mellitus without complications: Secondary | ICD-10-CM | POA: Diagnosis not present

## 2021-10-23 DIAGNOSIS — Z0000 Encounter for general adult medical examination without abnormal findings: Secondary | ICD-10-CM | POA: Diagnosis not present

## 2021-10-23 DIAGNOSIS — E538 Deficiency of other specified B group vitamins: Secondary | ICD-10-CM

## 2021-10-23 DIAGNOSIS — F330 Major depressive disorder, recurrent, mild: Secondary | ICD-10-CM

## 2021-10-23 DIAGNOSIS — Z6841 Body Mass Index (BMI) 40.0 and over, adult: Secondary | ICD-10-CM

## 2021-10-23 DIAGNOSIS — M5442 Lumbago with sciatica, left side: Secondary | ICD-10-CM

## 2021-10-23 DIAGNOSIS — I10 Essential (primary) hypertension: Secondary | ICD-10-CM

## 2021-10-23 DIAGNOSIS — L570 Actinic keratosis: Secondary | ICD-10-CM

## 2021-10-23 DIAGNOSIS — E782 Mixed hyperlipidemia: Secondary | ICD-10-CM | POA: Diagnosis not present

## 2021-10-23 DIAGNOSIS — G8929 Other chronic pain: Secondary | ICD-10-CM

## 2021-10-23 DIAGNOSIS — M5441 Lumbago with sciatica, right side: Secondary | ICD-10-CM

## 2021-10-23 DIAGNOSIS — Z1231 Encounter for screening mammogram for malignant neoplasm of breast: Secondary | ICD-10-CM

## 2021-10-23 NOTE — Progress Notes (Unsigned)
Complete physical exam  I,[name],acting as a scribe for [name], MD.,have documented all relevant documentation on the behalf of [name], MD,as directed by  [name], MD while in the presence of [name], MD.   Patient: Cheryl Yang   DOB: Jan 21, 1961   61 y.o. Female  MRN: [id number] Visit Date: 10/23/2021  Today's healthcare provider: [name], MD   Chief Complaint  Patient presents with   Annual Exam   Subjective    Cheryl Yang is a 61 y.o. female who presents today for a complete physical exam.  She reports consuming a general diet. Home exercise routine includes walking. She generally feels well. She reports sleeping fairly well. She does not have additional problems to discuss today.  Patient is trying to work hard on weight loss with some dietary changes. She does snore but has no known apnea. HPI    Past Medical History:  Diagnosis Date   Anemia    due to fibroids (has had hysterectomy)   Anxiety    Arthritis    Depression    Eczema    Environmental and seasonal allergies    GERD (gastroesophageal reflux disease)    occ, uses Prilosec as needed with anti-imflammatories   Hypertension    PONV (postoperative nausea and vomiting)    Past Surgical History:  Procedure Laterality Date   BACK SURGERY     lumbar surgery   COLONOSCOPY     EYE SURGERY Left    had hole in eye repaired   VAGINAL HYSTERECTOMY     Social History   Socioeconomic History   Marital status: Married    Spouse name: Not on file   Number of children: Not on file   Years of education: Not on file   Highest education level: Not on file  Occupational History   Not on file  Tobacco Use   Smoking status: Never   Smokeless tobacco: Never  Substance and Sexual Activity   Alcohol use: Yes    Alcohol/week: 0.0 standard drinks of alcohol    Comment: very rare   Drug use: No   Sexual activity: Yes  Other Topics Concern   Not on  file  Social History Narrative   Not on file   Social Determinants of Health   Financial Resource Strain: Not on file  Food Insecurity: Not on file  Transportation Needs: Not on file  Physical Activity: Not on file  Stress: Not on file  Social Connections: Not on file  Intimate Partner Violence: Not on file   Family Status  Relation Name Status   Mother  Alive   Father  Deceased       due to [place]   Daughter  Alive   Son  Alive   Brother  Alive   Neg Hx  (Not Specified)   Family History  Problem Relation Age of Onset   Heart attack Mother    CVA Mother    Restless legs syndrome Mother    Breast cancer Neg Hx    Allergies  Allergen Reactions   Salmon [Fish Allergy] Itching   Seldane [Terfenadine] Rash   Tagamet [Cimetidine] Itching and Rash    Patient Care Team: Jerrol Banana., MD as PCP - General (Family Medicine)   Medications: Outpatient Medications Prior to Visit  Medication Sig   clotrimazole-betamethasone (LOTRISONE) cream Apply 1 application topically 2 (two) times daily.  Coenzyme Q10 (CO Q10) 100 MG CAPS Take 200 mg by mouth daily.   fexofenadine (ALLEGRA) 180 MG tablet Take 180 mg by mouth daily.   gabapentin (NEURONTIN) 300 MG capsule TAKE 2 CAPSULES (600 MG TOTAL) BY MOUTH 2 (TWO) TIMES DAILY. 3 TABLETS DAILY   glucose blood test strip Check sugar once daily DX E11.9, patient has one touch ultra mini and needs strips and lancets for this   metFORMIN (GLUCOPHAGE) 500 MG tablet TAKE 1 TABLET BY MOUTH EVERY DAY WITH BREAKFAST   montelukast (SINGULAIR) 10 MG tablet TAKE 1 TABLET BY MOUTH EVERYDAY AT BEDTIME   naproxen sodium (ALEVE) 220 MG tablet Take 220 mg by mouth.   Omega-3 Fatty Acids (OMEGA 3 PO) Take by mouth.   rosuvastatin (CRESTOR) 5 MG tablet TAKE 1 TABLET (5 MG TOTAL) BY MOUTH DAILY.   sertraline (ZOLOFT) 100 MG tablet TAKE 1 TABLET BY MOUTH EVERY DAY   telmisartan-hydrochlorothiazide (MICARDIS HCT) 40-12.5 MG tablet  TAKE 1 TABLET BY MOUTH EVERY DAY   triamcinolone (NASACORT) 55 MCG/ACT AERO nasal inhaler Place into the nose daily.   vitamin B-12 (CYANOCOBALAMIN) 1000 MCG tablet Take 1,000 mcg by mouth daily.   VITAMIN D PO Take 1,000 Units by mouth daily at 12 noon.   No facility-administered medications prior to visit.    Review of Systems  Constitutional:  Positive for diaphoresis.  HENT:  Positive for sinus pressure and tinnitus.   Eyes:  Positive for discharge and itching.  Genitourinary:  Positive for urgency.  Musculoskeletal:  Positive for arthralgias and back pain.  Psychiatric/Behavioral:  The patient is nervous/anxious.   All other systems reviewed and are negative.   Last hemoglobin A1c Lab Results  Component Value Date   HGBA1C 6.3 (H) 10/23/2021      Objective     BP (!) 127/58 (BP Location: Left Arm, Patient Position: Sitting, Cuff Size: Large)   Pulse 80   Resp 16   Ht '5\' 5"'$  (1.651 m)   Wt 240 lb (108.9 kg)   SpO2 96%   BMI 39.94 kg/m  BP Readings from Last 3 Encounters:  10/23/21 (!) 127/58  12/26/20 123/76  11/30/20 120/88   Wt Readings from Last 3 Encounters:  10/23/21 240 lb (108.9 kg)  12/26/20 254 lb (115.2 kg)  11/30/20 240 lb (108.9 kg)       Physical Exam Vitals reviewed.  Constitutional:      Appearance: She is well-developed. She is obese.  HENT:     Head: Normocephalic and atraumatic.     Right Ear: Tympanic membrane, ear canal and external ear normal.     Left Ear: Tympanic membrane, ear canal and external ear normal.     Mouth/Throat:     Pharynx: Oropharynx is clear.  Eyes:     General: No scleral icterus.    Extraocular Movements: Extraocular movements intact.     Conjunctiva/sclera: Conjunctivae normal.     Pupils: Pupils are equal, round, and reactive to light.  Neck:     Thyroid: No thyromegaly.     Vascular: No carotid bruit.  Cardiovascular:     Rate and Rhythm: Normal rate and regular rhythm.     Heart sounds: Normal heart  sounds.  Pulmonary:     Effort: Pulmonary effort is normal.     Breath sounds: Normal breath sounds.  Abdominal:     Palpations: Abdomen is soft.  Musculoskeletal:     Right lower leg: No edema.     Left lower  leg: No edema.  Lymphadenopathy:     Cervical: No cervical adenopathy.  Skin:    General: Skin is warm and dry.  Neurological:     General: No focal deficit present.     Mental Status: She is alert and oriented to person, place, and time.     Motor: No weakness.  Psychiatric:        Mood and Affect: Mood normal.        Behavior: Behavior normal.        Thought Content: Thought content normal.        Judgment: Judgment normal.          10/23/2021   11:00 AM  Results of the Epworth flowsheet  Sitting and reading 0  Watching TV 0  Sitting, inactive in a public place (e.g. a theatre or a meeting) 0  As a passenger in a car for an hour without a break 0  Lying down to rest in the afternoon when circumstances permit 3  Sitting and talking to someone 0  Sitting quietly after a lunch without alcohol 0  In a car, while stopped for a few minutes in traffic 0  Total score 3    Last depression screening scores    10/23/2021    9:49 AM 07/21/2020    9:08 AM 07/20/2019    9:30 AM  PHQ 2/9 Scores  PHQ - 2 Score 0 0 0  PHQ- 9 Score 1 0 0   Last fall risk screening    10/23/2021    9:47 AM  [place] in the past year? 0  Number falls in past yr: 0  Injury with Fall? 0  Risk for fall due to : No Fall Risks  Follow up Falls evaluation completed   Last Audit-C alcohol use screening    10/23/2021    9:47 AM  Alcohol Use Disorder Test (AUDIT)  1. How often do you have a drink containing alcohol? 0  2. How many drinks containing alcohol do you have on a typical day when you are drinking? 0  3. How often do you have six or more drinks on one occasion? 0  AUDIT-C Score 0   A score of 3 or more in women, and 4 or more in men indicates increased risk for alcohol  abuse, EXCEPT if all of the points are from question 1   No results found for any visits on 10/23/21.  Assessment & Plan    Routine Health Maintenance and Physical Exam  Exercise Activities and Dietary recommendations  Goals   None     Immunization History  Administered Date(s) Administered   Influenza Inj Mdck Quad Pf 02/28/2017   Influenza,inj,Quad PF,6+ Mos 02/18/2018, 03/02/2019, 01/04/2020   Influenza-Unspecified 03/20/2015   PFIZER(Purple Top)SARS-COV-2 Vaccination 07/19/2019, 08/11/2019   Td 04/29/2003   Tdap 11/23/2013   Zoster Recombinat (Shingrix) 01/04/2020, 03/09/2020    Health Maintenance  Topic Date Due   OPHTHALMOLOGY EXAM  Never done   HIV Screening  Never done   FOOT EXAM  04/10/2017   PAP SMEAR-Modifier  08/13/2018   COVID-19 Vaccine (3 - Pfizer series) 10/06/2019   MAMMOGRAM  06/01/2021   HEMOGLOBIN A1C  06/09/2021   INFLUENZA VACCINE  12/12/2021   COLONOSCOPY (Pts 45-13yr Insurance coverage will need to be confirmed)  07/07/2022   TETANUS/TDAP  11/24/2023   Hepatitis C Screening  Completed   Zoster Vaccines- Shingrix  Completed   HPV VACCINES  Aged Out  Discussed health benefits of physical activity, and encouraged her to engage in regular exercise appropriate for her age and condition.  1. Annual physical exam Up-to-date on all screening. - Lipid panel - TSH - CBC w/Diff/Platelet - Comprehensive Metabolic Panel (CMET) - Hemoglobin A1c - Vitamin B12  2. Essential (primary) hypertension  - Lipid panel - TSH - CBC w/Diff/Platelet - Comprehensive Metabolic Panel (CMET) - Hemoglobin A1c - Vitamin B12  3. Diabetes mellitus without complication (HCC)  - Lipid panel - TSH - CBC w/Diff/Platelet - Comprehensive Metabolic Panel (CMET) - Hemoglobin A1c - Vitamin B12  4. Mixed hyperlipidemia  - Lipid panel - TSH - CBC w/Diff/Platelet - Comprehensive Metabolic Panel (CMET) - Hemoglobin A1c - Vitamin B12  5. B12 deficiency  -  Lipid panel - TSH - CBC w/Diff/Platelet - Comprehensive Metabolic Panel (CMET) - Hemoglobin A1c - Vitamin B12  6. Screening mammogram for breast cancer  - MM 3D SCREEN BREAST BILATERAL; Future  7. Solar keratosis  - Ambulatory referral to Dermatology 8.  Adiposity Weight loss with dietary changes and some exercise stressed. Epworth obtained. Return in about 6 months (around 04/24/2022).     I, [name], MD, have reviewed all documentation for this visit. The documentation on 10/25/21 for the exam, diagnosis, procedures, and orders are all accurate and complete.    Adanya Sosinski Cranford Mon, MD  [facility] [phone] (phone) [phone] (fax)  [place]

## 2021-10-24 LAB — LIPID PANEL
Chol/HDL Ratio: 2.7 ratio (ref 0.0–4.4)
Cholesterol, Total: 129 mg/dL (ref 100–199)
HDL: 47 mg/dL (ref >39)
LDL Chol Calc (NIH): 60 mg/dL (ref 0–99)
Triglycerides: 121 mg/dL (ref 0–149)
VLDL Cholesterol Cal: 22 mg/dL (ref 5–40)

## 2021-10-24 LAB — COMPREHENSIVE METABOLIC PANEL
ALT: 22 IU/L (ref 0–32)
AST: 22 IU/L (ref 0–40)
Albumin/Globulin Ratio: 2 (ref 1.2–2.2)
Albumin: 4.7 g/dL (ref 3.8–4.9)
Alkaline Phosphatase: 93 IU/L (ref 44–121)
BUN/Creatinine Ratio: 17 (ref 12–28)
BUN: 13 mg/dL (ref 8–27)
Bilirubin Total: 0.6 mg/dL (ref 0.0–1.2)
CO2: 26 mmol/L (ref 20–29)
Calcium: 9.8 mg/dL (ref 8.7–10.3)
Chloride: 101 mmol/L (ref 96–106)
Creatinine, Ser: 0.76 mg/dL (ref 0.57–1.00)
Globulin, Total: 2.3 g/dL (ref 1.5–4.5)
Glucose: 114 mg/dL — ABNORMAL HIGH (ref 70–99)
Potassium: 4.2 mmol/L (ref 3.5–5.2)
Sodium: 142 mmol/L (ref 134–144)
Total Protein: 7 g/dL (ref 6.0–8.5)
eGFR: 90 mL/min/{1.73_m2} (ref >59)

## 2021-10-24 LAB — CBC WITH DIFFERENTIAL/PLATELET
Basophils Absolute: 0 10*3/uL (ref 0.0–0.2)
Basos: 0 %
EOS (ABSOLUTE): 0.3 10*3/uL (ref 0.0–0.4)
Eos: 3 %
Hematocrit: 41.2 % (ref 34.0–46.6)
Hemoglobin: 13.7 g/dL (ref 11.1–15.9)
Immature Grans (Abs): 0 10*3/uL (ref 0.0–0.1)
Immature Granulocytes: 0 %
Lymphocytes Absolute: 2.3 10*3/uL (ref 0.7–3.1)
Lymphs: 23 %
MCH: 27.7 pg (ref 26.6–33.0)
MCHC: 33.3 g/dL (ref 31.5–35.7)
MCV: 83 fL (ref 79–97)
Monocytes Absolute: 0.7 10*3/uL (ref 0.1–0.9)
Monocytes: 7 %
Neutrophils Absolute: 6.7 10*3/uL (ref 1.4–7.0)
Neutrophils: 67 %
Platelets: 280 10*3/uL (ref 150–450)
RBC: 4.95 x10E6/uL (ref 3.77–5.28)
RDW: 13.7 % (ref 11.7–15.4)
WBC: 10 10*3/uL (ref 3.4–10.8)

## 2021-10-24 LAB — HEMOGLOBIN A1C
Est. average glucose Bld gHb Est-mCnc: 134 mg/dL
Hgb A1c MFr Bld: 6.3 % — ABNORMAL HIGH (ref 4.8–5.6)

## 2021-10-24 LAB — TSH: TSH: 2.02 u[IU]/mL (ref 0.450–4.500)

## 2021-10-24 LAB — VITAMIN B12: Vitamin B-12: 640 pg/mL (ref 232–1245)

## 2021-11-09 ENCOUNTER — Other Ambulatory Visit: Payer: Self-pay | Admitting: Family Medicine

## 2021-11-21 ENCOUNTER — Ambulatory Visit
Admission: RE | Admit: 2021-11-21 | Discharge: 2021-11-21 | Disposition: A | Discharge status: Home / Self Care | Payer: BC Managed Care – PPO | Source: Ambulatory Visit | Attending: Family Medicine | Admitting: Family Medicine

## 2021-11-21 DIAGNOSIS — Z1231 Encounter for screening mammogram for malignant neoplasm of breast: Secondary | ICD-10-CM | POA: Diagnosis not present

## 2021-12-04 ENCOUNTER — Telehealth: Payer: Self-pay

## 2021-12-04 NOTE — Telephone Encounter (Signed)
Pt states she has some left over pain medication and can wait until her video visit with [place] tomorrow.

## 2021-12-04 NOTE — Telephone Encounter (Signed)
Copied from [place] [id number]. Topic: Appointment Scheduling - Scheduling Inquiry for Clinic >> Dec 04, 2021  8:36 AM Kristeen Miss R wrote: Reason for CRM: patient called wanting something sent in for her back pain. >> Dec 04, 2021  8:43 AM Kristeen Miss R wrote: Patient stated that she was having some back spasms due to some extra cleaning she did over the weekend. She stated that Dr. Rosanna Randy has seen her for this prior to her (2) back surgeries. She was asking for some meds to be sent in to [facility] [address] [address] ([facility] preferred [facility]). There was only 1 OV opening for tomorrow at 10:40am with Mardene Speak, in which she took, but stated she is willing to do a video visit or tele visit if there was something open today.

## 2021-12-05 ENCOUNTER — Telehealth (INDEPENDENT_AMBULATORY_CARE_PROVIDER_SITE_OTHER): Payer: BC Managed Care – PPO | Admitting: Physician Assistant

## 2021-12-05 DIAGNOSIS — M62838 Other muscle spasm: Secondary | ICD-10-CM | POA: Diagnosis not present

## 2021-12-05 MED ORDER — CYCLOBENZAPRINE HCL 5 MG PO TABS: 5.0000 mg | ORAL_TABLET | Freq: Three times a day (TID) | ORAL | 1 refills | Status: DC | PRN

## 2021-12-05 NOTE — Progress Notes (Signed)
MyChart Video Visit    Virtual Visit via Video Note   This visit type was conducted due to national recommendations for restrictions regarding the COVID-19 Pandemic (e.g. social distancing) in an effort to limit this patient's exposure and mitigate transmission in our community. This patient is at least at moderate risk for complications without adequate follow up. This format is felt to be most appropriate for this patient at this time. Physical exam was limited by quality of the video and audio technology used for the visit.   Patient location: Home  Provider location: [facility]   I discussed the limitations of evaluation and management by telemedicine and the availability of in person appointments. The patient expressed understanding and agreed to proceed.  Patient: Cheryl Yang   DOB: [birth year]   61 y.o. Female  MRN: [id number] Visit Date: 12/05/2021  Today's healthcare provider: Mardene Speak, PA-C  CC: back spasms  Subjective     HPI   Back spasms  Last edited by Alanson Puls, CMA on 12/05/2021 10:20 AM.    Patient reports back spasms with onset of 2-3 wks ago after cleaning out closets. Reports two episodes of bladder leakage that patient feels was from spasms?  Started taking Flexeril and Hydrocodone 3 days ago that was left over from back surgery with temporary relief. Reports improvement since onset with a dull pain currently.  History of back surgery in 2005 and 2016.   Medications: Outpatient Medications Prior to Visit  Medication Sig   clotrimazole-betamethasone (LOTRISONE) cream Apply 1 application topically 2 (two) times daily.   Coenzyme Q10 (CO Q10) 100 MG CAPS Take 200 mg by mouth daily.   fexofenadine (ALLEGRA) 180 MG tablet Take 180 mg by mouth daily.   gabapentin (NEURONTIN) 300 MG capsule TAKE 2 CAPSULES (600 MG TOTAL) BY MOUTH 2 (TWO) TIMES DAILY. 3 TABLETS DAILY   glucose blood test strip Check sugar once daily DX E11.9, patient  has one touch ultra mini and needs strips and lancets for this   metFORMIN (GLUCOPHAGE) 500 MG tablet TAKE 1 TABLET BY MOUTH EVERY DAY WITH BREAKFAST   montelukast (SINGULAIR) 10 MG tablet TAKE 1 TABLET BY MOUTH EVERYDAY AT BEDTIME   naproxen sodium (ALEVE) 220 MG tablet Take 220 mg by mouth.   Omega-3 Fatty Acids (OMEGA 3 PO) Take by mouth.   rosuvastatin (CRESTOR) 5 MG tablet TAKE 1 TABLET (5 MG TOTAL) BY MOUTH DAILY.   sertraline (ZOLOFT) 100 MG tablet TAKE 1 TABLET BY MOUTH EVERY DAY   telmisartan-hydrochlorothiazide (MICARDIS HCT) 40-12.5 MG tablet TAKE 1 TABLET BY MOUTH EVERY DAY   triamcinolone (NASACORT) 55 MCG/ACT AERO nasal inhaler Place into the nose daily.   vitamin B-12 (CYANOCOBALAMIN) 1000 MCG tablet Take 1,000 mcg by mouth daily.   VITAMIN D PO Take 1,000 Units by mouth daily at 12 noon.   No facility-administered medications prior to visit.    Review of Systems  Respiratory: Negative.    Cardiovascular: Negative.   Gastrointestinal: Negative.   Musculoskeletal:  Positive for back pain.  Psychiatric/Behavioral: Negative.         Objective    There were no vitals taken for this visit.     Physical Exam Constitutional:      General: She is not in acute distress.    Appearance: Normal appearance.  HENT:     Head: Normocephalic.  Pulmonary:     Effort: Pulmonary effort is normal. No respiratory distress.  Neurological:  Mental Status: She is alert and oriented to person, place, and time. Mental status is at baseline.       Assessment & Plan    1. Muscle spasm - cyclobenzaprine (FLEXERIL) 5 MG tablet; Take 1 tablet (5 mg total) by mouth 3 (three) times daily as needed for muscle spasms.  Dispense: 45 tablet; Refill: 1 OTC pain medications, ice/heat, topical antiinflammatory The patient was advised to call back or seek an in-person evaluation if the symptoms worsen or if the condition fails to improve as anticipated.  FU as scheduled   I discussed  the assessment and treatment plan with the patient. The patient was provided an opportunity to ask questions and all were answered. The patient agreed with the plan and demonstrated an understanding of the instructions.   I provided 12 minutes of non-face-to-face time during this encounter.  The entirety of the information documented in the History of Present Illness, Review of Systems and Physical Exam were personally obtained by me. Portions of this information were initially documented by the CMA and reviewed by me for thoroughness and accuracy.  Portions of this note were created using dictation software and may contain typographical errors.   Mardene Speak, PA-C [facility] [phone] (phone) [phone] (fax)  [place]

## 2021-12-28 NOTE — Progress Notes (Unsigned)
[place],acting as a scribe for [name], MD.,have documented all relevant documentation on the behalf of [name], MD,as directed by  [name], MD while in the presence of [name], MD.   Established patient visit   Patient: Cheryl Yang   DOB: [birth year]   61 y.o. Female  MRN: [id number] Visit Date: 01/01/2022  Today's healthcare provider: [name], MD   No chief complaint on file.  Subjective    HPI  Patient is a 61 year old female who presents for evaluation of back spasms.  She has chronic back issue and history of back surgery times 2.  She states about 3 weeks ago she had increased spasms and pain.  She was given a muscle relaxer and told to follow up.  She states she is feeling some better but not back to what 'her' normal is.   She states she has not needed to take any of the muscle relaxer in about 1 week.    Medications: Outpatient Medications Prior to Visit  Medication Sig   clotrimazole-betamethasone (LOTRISONE) cream Apply 1 application topically 2 (two) times daily.   Coenzyme Q10 (CO Q10) 100 MG CAPS Take 200 mg by mouth daily.   cyclobenzaprine (FLEXERIL) 5 MG tablet Take 1 tablet (5 mg total) by mouth 3 (three) times daily as needed for muscle spasms.   fexofenadine (ALLEGRA) 180 MG tablet Take 180 mg by mouth daily.   gabapentin (NEURONTIN) 300 MG capsule TAKE 2 CAPSULES (600 MG TOTAL) BY MOUTH 2 (TWO) TIMES DAILY. 3 TABLETS DAILY   glucose blood test strip Check sugar once daily DX E11.9, patient has one touch ultra mini and needs strips and lancets for this   metFORMIN (GLUCOPHAGE) 500 MG tablet TAKE 1 TABLET BY MOUTH EVERY DAY WITH BREAKFAST   montelukast (SINGULAIR) 10 MG tablet TAKE 1 TABLET BY MOUTH EVERYDAY AT BEDTIME   naproxen sodium (ALEVE) 220 MG tablet Take 220 mg by mouth.   Omega-3 Fatty Acids (OMEGA 3 PO) Take by mouth.   rosuvastatin (CRESTOR) 5 MG tablet TAKE 1 TABLET (5 MG TOTAL) BY  MOUTH DAILY.   sertraline (ZOLOFT) 100 MG tablet TAKE 1 TABLET BY MOUTH EVERY DAY   telmisartan-hydrochlorothiazide (MICARDIS HCT) 40-12.5 MG tablet TAKE 1 TABLET BY MOUTH EVERY DAY   triamcinolone (NASACORT) 55 MCG/ACT AERO nasal inhaler Place into the nose daily.   vitamin B-12 (CYANOCOBALAMIN) 1000 MCG tablet Take 1,000 mcg by mouth daily.   VITAMIN D PO Take 1,000 Units by mouth daily at 12 noon.   No facility-administered medications prior to visit.    Review of Systems  Musculoskeletal:  Positive for back pain.    {Labs  Heme  Chem  Endocrine  Serology  Results Review (optional):[id number]}   Objective    BP 135/81 (BP Location: Right Arm, Patient Position: Sitting, Cuff Size: Large)   Pulse 83   Temp 98.3 F (36.8 C) (Oral)   Wt 243 lb (110.2 kg)   SpO2 95%   BMI 40.44 kg/m  Vitals:   01/01/22 0807 01/01/22 0811  BP: (!) 143/85 135/81  Pulse: 83   Temp: 98.3 F (36.8 C)   TempSrc: Oral   SpO2: 95%   Weight: 243 lb (110.2 kg)      Physical Exam  ***  No results found for any visits on 01/01/22.  Assessment & Plan     ***  No follow-ups on file.      {  provider attestation***:1}   [name], MD  [facility] [phone] (phone) [phone] (fax)  [place]

## 2022-01-01 ENCOUNTER — Ambulatory Visit: Payer: BC Managed Care – PPO | Admitting: Family Medicine

## 2022-01-01 VITALS — BP 135/81 | HR 83 | Temp 98.3°F | Wt 243.0 lb

## 2022-01-01 DIAGNOSIS — Z23 Encounter for immunization: Secondary | ICD-10-CM | POA: Diagnosis not present

## 2022-01-01 DIAGNOSIS — I10 Essential (primary) hypertension: Secondary | ICD-10-CM

## 2022-01-01 DIAGNOSIS — M62838 Other muscle spasm: Secondary | ICD-10-CM | POA: Diagnosis not present

## 2022-01-01 DIAGNOSIS — E782 Mixed hyperlipidemia: Secondary | ICD-10-CM | POA: Diagnosis not present

## 2022-01-01 DIAGNOSIS — E66813 Obesity, class 3: Secondary | ICD-10-CM

## 2022-01-01 DIAGNOSIS — Z6841 Body Mass Index (BMI) 40.0 and over, adult: Secondary | ICD-10-CM

## 2022-01-01 MED ORDER — CYCLOBENZAPRINE HCL 5 MG PO TABS
5.0000 mg | ORAL_TABLET | Freq: Three times a day (TID) | ORAL | 1 refills | Status: AC | PRN
Start: 2022-01-01 — End: ?

## 2022-01-01 NOTE — Patient Instructions (Signed)
Use heating pad and TENS unit for back pain

## 2022-02-13 ENCOUNTER — Other Ambulatory Visit: Payer: Self-pay | Admitting: Family Medicine

## 2022-03-20 ENCOUNTER — Telehealth: Payer: Self-pay

## 2022-03-20 NOTE — Telephone Encounter (Signed)
Copied from [place] [id number]. Topic: General - Other >> Mar 20, 2022  4:07 PM Leilani Able wrote: Reason for CRM: Pt states had wanted to speak  Arbie Cookey re.. request fu at [phone]

## 2022-03-27 DIAGNOSIS — D2371 Other benign neoplasm of skin of right lower limb, including hip: Secondary | ICD-10-CM | POA: Diagnosis not present

## 2022-03-27 DIAGNOSIS — L400 Psoriasis vulgaris: Secondary | ICD-10-CM | POA: Diagnosis not present

## 2022-03-27 DIAGNOSIS — L821 Other seborrheic keratosis: Secondary | ICD-10-CM | POA: Diagnosis not present

## 2022-03-27 DIAGNOSIS — L304 Erythema intertrigo: Secondary | ICD-10-CM | POA: Diagnosis not present

## 2022-04-12 ENCOUNTER — Other Ambulatory Visit: Payer: Self-pay | Admitting: Family Medicine

## 2022-04-13 ENCOUNTER — Other Ambulatory Visit: Payer: Self-pay | Admitting: Family Medicine

## 2022-04-13 DIAGNOSIS — J309 Allergic rhinitis, unspecified: Secondary | ICD-10-CM

## 2022-04-13 DIAGNOSIS — G8929 Other chronic pain: Secondary | ICD-10-CM

## 2022-04-13 DIAGNOSIS — E119 Type 2 diabetes mellitus without complications: Secondary | ICD-10-CM

## 2022-04-13 MED ORDER — METFORMIN HCL 500 MG PO TABS
ORAL_TABLET | ORAL | 0 refills | Status: DC
Start: 2022-04-13 — End: 2022-07-11

## 2022-04-13 MED ORDER — MONTELUKAST SODIUM 10 MG PO TABS: ORAL_TABLET | ORAL | 0 refills | Status: DC

## 2022-04-13 MED ORDER — ROSUVASTATIN CALCIUM 5 MG PO TABS: 5.0000 mg | ORAL_TABLET | Freq: Every day | ORAL | 0 refills | Status: DC

## 2022-04-13 MED ORDER — GABAPENTIN 300 MG PO CAPS: 600.0000 mg | ORAL_CAPSULE | Freq: Two times a day (BID) | ORAL | 1 refills | Status: DC

## 2022-04-13 NOTE — Telephone Encounter (Signed)
Medication Refill - Medication: rosuvastatin (CRESTOR) 5 MG tablet [[id number]]  montelukast (SINGULAIR) 10 MG tablet [[id number]]  gabapentin (NEURONTIN) 300 MG capsule [[id number]]  metFORMIN (GLUCOPHAGE) 500 MG tablet [[id number]]    Has the patient contacted their pharmacy? Yes.   (Agent: If no, request that the patient contact the pharmacy for the refill. If patient does not wish to contact the pharmacy document the reason why and proceed with request.) (Agent: If yes, when and what did the pharmacy advise?)  Preferred Pharmacy (with phone number or street name): [id number] [address] [place] Has the patient been seen for an appointment in the last year OR does the patient have an upcoming appointment? Yes.    Agent: Please be advised that RX refills may take up to 3 business days. We ask that you follow-up with your pharmacy.

## 2022-04-13 NOTE — Telephone Encounter (Signed)
Requested medication (s) are due for refill today: yes  Requested medication (s) are on the active medication list: yes  Last refill:  10/06/21  Future visit scheduled: yes  Notes to clinic:  Unable to refill per protocol, last refill by another provider. Provider no longer at practice, routing for review.     Requested Prescriptions  Pending Prescriptions Disp Refills   rosuvastatin (CRESTOR) 5 MG tablet 90 tablet 1    Sig: Take 1 tablet (5 mg total) by mouth daily.     Cardiovascular:  Antilipid - Statins 2 Failed - 04/13/2022 12:58 PM      Failed - Lipid Panel in normal range within the last 12 months    Cholesterol, Total  Date Value Ref Range Status  10/23/2021 129 100 - 199 mg/dL Final   LDL Chol Calc (NIH)  Date Value Ref Range Status  10/23/2021 60 0 - 99 mg/dL Final   HDL  Date Value Ref Range Status  10/23/2021 47 >39 mg/dL Final   Triglycerides  Date Value Ref Range Status  10/23/2021 121 0 - 149 mg/dL Final         Passed - Cr in normal range and within 360 days    Creatinine  Date Value Ref Range Status  05/28/2014 0.97 0.60 - 1.30 mg/dL Final   Creatinine, Ser  Date Value Ref Range Status  10/23/2021 0.76 0.57 - 1.00 mg/dL Final         Passed - Patient is not pregnant      Passed - Valid encounter within last 12 months    Recent Outpatient Visits           3 months ago Need for influenza vaccination   [facility] Jerrol Banana., MD   4 months ago Muscle spasm   [facility] [place], [place], PA-C   5 months ago Annual physical exam   [facility] Jerrol Banana., MD   1 year ago [place], Retia Passe., MD   1 year ago Annual physical exam   [facility] Jerrol Banana., MD       Future Appointments             In 2 weeks Ralene Bathe, MD [place]             montelukast (SINGULAIR) 10 MG tablet 90  tablet 2     Pulmonology:  Leukotriene Inhibitors Passed - 04/13/2022 12:58 PM      Passed - Valid encounter within last 12 months    Recent Outpatient Visits           3 months ago Need for influenza vaccination   [facility] Jerrol Banana., MD   4 months ago Muscle spasm   [facility] [place], [place], PA-C   5 months ago Annual physical exam   [facility] Jerrol Banana., MD   1 year ago [place] Jerrol Banana., MD   1 year ago Annual physical exam   [facility] Jerrol Banana., MD       Future Appointments             In 2 weeks Ralene Bathe, MD [place]             metFORMIN (GLUCOPHAGE) 500 MG tablet 90 tablet 1  Endocrinology:  Diabetes - Biguanides Passed - 04/13/2022 12:58 PM      Passed - Cr in normal range and within 360 days    Creatinine  Date Value Ref Range Status  05/28/2014 0.97 0.60 - 1.30 mg/dL Final   Creatinine, Ser  Date Value Ref Range Status  10/23/2021 0.76 0.57 - 1.00 mg/dL Final         Passed - HBA1C is between 0 and 7.9 and within 180 days    Hemoglobin A1C  Date Value Ref Range Status  12/07/2020 6.3  Final   Hgb A1c MFr Bld  Date Value Ref Range Status  10/23/2021 6.3 (H) 4.8 - 5.6 % Final    Comment:             Prediabetes: 5.7 - 6.4          Diabetes: >6.4          Glycemic control for adults with diabetes: <7.0          Passed - eGFR in normal range and within 360 days    EGFR (African American)  Date Value Ref Range Status  05/28/2014 >60 >55m/min Final   GFR calc Af Amer  Date Value Ref Range Status  01/04/2020 111 >59 mL/min/1.73 Final    Comment:    **[facility] currently reports eGFR in compliance with the current**   recommendations of the [employer] [facility] will   update reporting as new guidelines are published from the NKF-ASN   Task force.    EGFR  (Non-African Amer.)  Date Value Ref Range Status  05/28/2014 >60 >665mmin Final    Comment:    eGFR values <6061min/1.73 m2 may be an indication of chronic kidney disease (CKD). Calculated eGFR, using the MRDR Study equation, is useful in  patients with stable renal function. The eGFR calculation will not be reliable in acutely ill patients when serum creatinine is changing rapidly. It is not useful in patients on dialysis. The eGFR calculation may not be applicable to patients at the low and high extremes of body sizes, pregnant women, and vegetarians.  - [facility]  [facility]  - READ-BACK PROCESS PERFORMED.    GFR calc non Af Amer  Date Value Ref Range Status  01/04/2020 96 >59 mL/min/1.73 Final   eGFR  Date Value Ref Range Status  10/23/2021 90 >59 mL/min/1.73 Final         Passed - B12 Level in normal range and within 720 days    Vitamin B-12  Date Value Ref Range Status  10/23/2021 640 232 - 1,245 pg/mL Final         Passed - Valid encounter within last 6 months    Recent Outpatient Visits           3 months ago Need for influenza vaccination   [facility]   4 months ago [facility]cle sp[facility]   [facility]   5 months ago Annual physical exam   [facility]   [facility]   1 year ago Annual physical exam   [facility]       Future Appointments             In 2 weeks [place]  Passed - CBC within normal limits and completed in the last 12 months    WBC  Date Value Ref Range Status  10/23/2021 10.0 3.4 - 10.8 x10E3/uL Final  07/16/2014 8.5 4.0 - 10.5 K/uL Final   RBC  Date Value Ref Range Status  10/23/2021 4.95 3.77 - 5.28 x10E6/uL Final  07/16/2014 4.74 3.87 - 5.11 MIL/uL Final    Hemoglobin  Date Value Ref Range Status  10/23/2021 13.7 11.1 - 15.9 g/dL Final   Hematocrit  Date Value Ref Range Status  10/23/2021 41.2 34.0 - 46.6 % Final   MCHC  Date Value Ref Range Status  10/23/2021 33.3 31.5 - 35.7 g/dL Final  07/16/2014 33.9 30.0 - 36.0 g/dL Final   [facility]  Date Value Ref Range Status  10/23/2021 27.7 26.6 - 33.0 pg Final  07/16/2014 28.9 26.0 - 34.0 pg Final   MCV  Date Value Ref Range Status  10/23/2021 83 79 - 97 fL Final   No results found for: "PLTCOUNTKUC", "LABPLAT", "POCPLA" RDW  Date Value Ref Range Status  10/23/2021 13.7 11.7 - 15.4 % Final          gabapentin (NEURONTIN) 300 MG capsule 360 capsule 3    Sig: Take 2 capsules (600 mg total) by mouth 2 (two) times daily. 3 tablets daily     Neurology: Anticonvulsants - gabapentin Passed - 04/13/2022 12:58 PM      Passed - Cr in normal range and within 360 days    Creatinine  Date Value Ref Range Status  05/28/2014 0.97 0.60 - 1.30 mg/dL Final   Creatinine, Ser  Date Value Ref Range Status  10/23/2021 0.76 0.57 - 1.00 mg/dL Final         Passed - Completed PHQ-2 or PHQ-9 in the last 360 days      Passed - Valid encounter within last 12 months    Recent Outpatient Visits           3 months ago Need for influenza vaccination   [facility] Jerrol Banana., MD   4 months ago Muscle spasm   [facility] [place], [place], PA-C   5 months ago Annual physical exam   [facility] Jerrol Banana., MD   1 year ago [place] Jerrol Banana., MD   1 year ago Annual physical exam   [facility] Jerrol Banana., MD       Future Appointments             In 2 weeks Ralene Bathe, MD [place]

## 2022-04-24 ENCOUNTER — Ambulatory Visit: Payer: BC Managed Care – PPO | Admitting: Family Medicine

## 2022-04-30 ENCOUNTER — Ambulatory Visit: Payer: BC Managed Care – PPO | Admitting: Dermatology

## 2022-05-25 ENCOUNTER — Other Ambulatory Visit: Payer: Self-pay | Admitting: Family Medicine

## 2022-05-25 NOTE — Telephone Encounter (Signed)
Requested medication (s) are due for refill today: yes  Requested medication (s) are on the active medication list: yes  Last refill:  02/13/22  Future visit scheduled:no  Notes to clinic:  Unable to refill per protocol, last refill by another provider not at the practice, has not had OV with another provider to establish care, routing for review.     Requested Prescriptions  Pending Prescriptions Disp Refills   telmisartan-hydrochlorothiazide (MICARDIS HCT) 40-12.5 MG tablet [Pharmacy Med Name: TELMISARTAN-HCTZ 40-12.5 MG TB] 90 tablet 0    Sig: TAKE 1 TABLET BY MOUTH EVERY DAY     Cardiovascular: ARB + Diuretic Combos Failed - 05/25/2022  2:56 PM      Failed - K in normal range and within 180 days    Potassium  Date Value Ref Range Status  10/23/2021 4.2 3.5 - 5.2 mmol/L Final         Failed - Na in normal range and within 180 days    Sodium  Date Value Ref Range Status  10/23/2021 142 134 - 144 mmol/L Final         Failed - Cr in normal range and within 180 days    Creatinine  Date Value Ref Range Status  05/28/2014 0.97 0.60 - 1.30 mg/dL Final   Creatinine, Ser  Date Value Ref Range Status  10/23/2021 0.76 0.57 - 1.00 mg/dL Final         Failed - eGFR is 10 or above and within 180 days    EGFR (African American)  Date Value Ref Range Status  05/28/2014 >60 >61m/min Final   GFR calc Af Amer  Date Value Ref Range Status  01/04/2020 111 >59 mL/min/1.73 Final    Comment:    **[facility] currently reports eGFR in compliance with the current**   recommendations of the [employer] [facility] will   update reporting as new guidelines are published from the NKF-ASN   Task force.    EGFR (Non-African Amer.)  Date Value Ref Range Status  05/28/2014 >60 >690mmin Final    Comment:    eGFR values <6010min/1.73 m2 may be an indication of chronic kidney disease (CKD). Calculated eGFR, using the MRDR Study equation, is useful in  patients with stable renal  function. The eGFR calculation will not be reliable in acutely ill patients when serum creatinine is changing rapidly. It is not useful in patients on dialysis. The eGFR calculation may not be applicable to patients at the low and high extremes of body sizes, pregnant women, and vegetarians.  - [facility] '@1348'$  scc  - READ-BACK PROCESS PERFORMED.    GFR calc non Af Amer  Date Value Ref Range Status  01/04/2020 96 >59 mL/min/1.73 Final   eGFR  Date Value Ref Range Status  10/23/2021 90 >59 mL/min/1.73 Final         Passed - Patient is not pregnant      Passed - Last BP in normal range    BP Readings from Last 1 Encounters:  01/01/22 135/81         Passed - Valid encounter within last 6 months    Recent Outpatient Visits           4 months ago Need for influenza vaccination   [facility]   5 months ago Muscle spasm   [facility]   7 months ago Annual physical exam   [facility]  Ret[facility] Pas[facility]   1 year ago [place] Jerrol Banana., MD   1 year ago Annual physical exam   [facility] Jerrol Banana., MD

## 2022-07-11 ENCOUNTER — Other Ambulatory Visit: Payer: Self-pay | Admitting: Family Medicine

## 2022-07-11 DIAGNOSIS — E119 Type 2 diabetes mellitus without complications: Secondary | ICD-10-CM

## 2022-08-02 ENCOUNTER — Other Ambulatory Visit: Payer: Self-pay | Admitting: Family Medicine

## 2022-08-02 DIAGNOSIS — E119 Type 2 diabetes mellitus without complications: Secondary | ICD-10-CM

## 2022-08-06 ENCOUNTER — Other Ambulatory Visit: Payer: Self-pay | Admitting: Family Medicine

## 2022-08-06 DIAGNOSIS — E119 Type 2 diabetes mellitus without complications: Secondary | ICD-10-CM

## 2022-08-19 ENCOUNTER — Other Ambulatory Visit: Payer: Self-pay | Admitting: Family Medicine

## 2022-08-19 DIAGNOSIS — J309 Allergic rhinitis, unspecified: Secondary | ICD-10-CM

## 2022-08-24 ENCOUNTER — Other Ambulatory Visit: Payer: Self-pay | Admitting: Physician Assistant

## 2022-08-29 ENCOUNTER — Other Ambulatory Visit: Payer: Self-pay | Admitting: Family Medicine

## 2022-08-29 DIAGNOSIS — E119 Type 2 diabetes mellitus without complications: Secondary | ICD-10-CM

## 2022-09-04 DIAGNOSIS — R7303 Prediabetes: Secondary | ICD-10-CM | POA: Diagnosis not present

## 2022-09-04 DIAGNOSIS — I10 Essential (primary) hypertension: Secondary | ICD-10-CM | POA: Diagnosis not present

## 2022-09-04 DIAGNOSIS — J301 Allergic rhinitis due to pollen: Secondary | ICD-10-CM | POA: Diagnosis not present

## 2022-09-11 ENCOUNTER — Other Ambulatory Visit: Payer: Self-pay | Admitting: Family Medicine

## 2022-09-11 DIAGNOSIS — J309 Allergic rhinitis, unspecified: Secondary | ICD-10-CM

## 2022-09-12 NOTE — Telephone Encounter (Signed)
Requested medication (s) are due for refill today:   Yes  Requested medication (s) are on the active medication list:   Yes  Future visit scheduled:   No   Last ordered: 08/19/2022 #30, 0 refills  Wasn't sure  which provider is to fill this.   Dr. Neita Garnet and Dr. Sherrie Mustache have been approving her refills.    She has an appt. Set up for a physical on 11/07/2022 at [facility].      Requested Prescriptions  Pending Prescriptions Disp Refills   montelukast (SINGULAIR) 10 MG tablet [Pharmacy Med Name: MONTELUKAST SOD 10 MG TABLET] 90 tablet 1    Sig: TAKE 1 TABLET BY MOUTH EVERYDAY AT BEDTIME     Pulmonology:  Leukotriene Inhibitors Passed - 09/11/2022 12:30 PM      Passed - Valid encounter within last 12 months    Recent Outpatient Visits           8 months ago Need for influenza vaccination   [facility] Bosie Clos, MD   9 months ago Muscle spasm   [facility] [place], [place], PA-C   10 months ago Annual physical exam   [facility] Bosie Clos, MD   1 year ago COVID-19   [facility] Bosie Clos, MD   2 years ago Annual physical exam   [facility] Bosie Clos, MD

## 2022-09-17 ENCOUNTER — Other Ambulatory Visit: Payer: Self-pay | Admitting: Physician Assistant

## 2022-09-30 ENCOUNTER — Other Ambulatory Visit: Payer: Self-pay | Admitting: Family Medicine

## 2022-09-30 DIAGNOSIS — J309 Allergic rhinitis, unspecified: Secondary | ICD-10-CM

## 2022-10-12 DIAGNOSIS — I10 Essential (primary) hypertension: Secondary | ICD-10-CM | POA: Diagnosis not present

## 2022-10-12 DIAGNOSIS — E119 Type 2 diabetes mellitus without complications: Secondary | ICD-10-CM | POA: Diagnosis not present

## 2022-10-12 DIAGNOSIS — E7800 Pure hypercholesterolemia, unspecified: Secondary | ICD-10-CM | POA: Diagnosis not present

## 2022-10-25 ENCOUNTER — Encounter: Payer: BC Managed Care – PPO | Admitting: Family Medicine

## 2022-11-13 DIAGNOSIS — Z0000 Encounter for general adult medical examination without abnormal findings: Secondary | ICD-10-CM | POA: Diagnosis not present

## 2022-11-13 DIAGNOSIS — E538 Deficiency of other specified B group vitamins: Secondary | ICD-10-CM | POA: Diagnosis not present

## 2022-11-13 DIAGNOSIS — E7800 Pure hypercholesterolemia, unspecified: Secondary | ICD-10-CM | POA: Diagnosis not present

## 2022-11-13 DIAGNOSIS — I10 Essential (primary) hypertension: Secondary | ICD-10-CM | POA: Diagnosis not present

## 2022-11-13 DIAGNOSIS — E119 Type 2 diabetes mellitus without complications: Secondary | ICD-10-CM | POA: Diagnosis not present

## 2022-11-30 ENCOUNTER — Other Ambulatory Visit: Payer: Self-pay | Admitting: Family Medicine

## 2022-11-30 DIAGNOSIS — J309 Allergic rhinitis, unspecified: Secondary | ICD-10-CM

## 2022-11-30 NOTE — Telephone Encounter (Signed)
Requested Prescriptions  Refused Prescriptions Disp Refills   montelukast (SINGULAIR) 10 MG tablet [Pharmacy Med Name: MONTELUKAST SOD 10 MG TABLET] 30 tablet 0    Sig: TAKE 1 TABLET BY MOUTH EVERYDAY AT BEDTIME     Pulmonology:  Leukotriene Inhibitors Passed - 11/30/2022 10:41 AM      Passed - Valid encounter within last 12 months    Recent Outpatient Visits           11 months ago Need for influenza vaccination   [facility] Bos[facility], [facility]   12 months ago Muscle spasm   [place] [facility] [place], [place], PA-C   1 year ago Annual physical exam   [facility] Bosie Clos, MD   1 year ago COVID-19   [facility] Bosie Clos, MD   2 years ago Annual physical exam   [facility] Bosie Clos, MD

## 2023-02-07 DIAGNOSIS — E119 Type 2 diabetes mellitus without complications: Secondary | ICD-10-CM | POA: Diagnosis not present

## 2023-02-07 DIAGNOSIS — I10 Essential (primary) hypertension: Secondary | ICD-10-CM | POA: Diagnosis not present

## 2023-02-07 DIAGNOSIS — Z23 Encounter for immunization: Secondary | ICD-10-CM | POA: Diagnosis not present

## 2023-02-19 DIAGNOSIS — Z01411 Encounter for gynecological examination (general) (routine) with abnormal findings: Secondary | ICD-10-CM | POA: Diagnosis not present

## 2023-02-19 DIAGNOSIS — Z1331 Encounter for screening for depression: Secondary | ICD-10-CM | POA: Diagnosis not present

## 2023-02-19 DIAGNOSIS — R232 Flushing: Secondary | ICD-10-CM | POA: Diagnosis not present

## 2023-02-19 DIAGNOSIS — N393 Stress incontinence (female) (male): Secondary | ICD-10-CM | POA: Diagnosis not present

## 2023-02-23 ENCOUNTER — Other Ambulatory Visit: Payer: Self-pay | Admitting: Family Medicine

## 2023-02-23 DIAGNOSIS — E119 Type 2 diabetes mellitus without complications: Secondary | ICD-10-CM

## 2023-02-23 DIAGNOSIS — J309 Allergic rhinitis, unspecified: Secondary | ICD-10-CM

## 2023-02-25 NOTE — Telephone Encounter (Signed)
Cheryl Yang is Cheryl Yang of Dr. Sullivan Lone, [facility] Requested Prescriptions  Refused Prescriptions Disp Refills   metFORMIN (GLUCOPHAGE) 500 MG tablet [Pharmacy Med Name: METFORMIN HCL 500 MG TABLET] 90 tablet 0    Sig: TAKE 1 TABLET BY MOUTH EVERY DAY WITH BREAKFAST     Endocrinology:  Diabetes - Biguanides Failed - 02/23/2023 10:29 AM      Failed - Cr in normal range and within 360 days    Creatinine  Date Value Ref Range Status  05/28/2014 0.97 0.60 - 1.30 mg/dL Final   Creatinine, Ser  Date Value Ref Range Status  10/23/2021 0.76 0.57 - 1.00 mg/dL Final         Failed - HBA1C is between 0 and 7.9 and within 180 days    Hemoglobin A1C  Date Value Ref Range Status  12/07/2020 6.3  Final   Hgb A1c MFr Bld  Date Value Ref Range Status  10/23/2021 6.3 (H) 4.8 - 5.6 % Final    Comment:             Prediabetes: 5.7 - 6.4          Diabetes: >6.4          Glycemic control for adults with diabetes: <7.0          Failed - eGFR in normal range and within 360 days    EGFR (African American)  Date Value Ref Range Status  05/28/2014 >60 >24mL/min Final   GFR calc Af Amer  Date Value Ref Range Status  01/04/2020 111 >59 mL/min/1.73 Final    Comment:    **[facility] currently reports eGFR in compliance with the current**   recommendations of the [employer]. [facility] will   update reporting as new guidelines are published from the NKF-ASN   Task force.    EGFR (Non-African Amer.)  Date Value Ref Range Status  05/28/2014 >60 >41mL/min Final    Comment:    eGFR values <36mL/min/1.73 m2 may be an indication of chronic kidney disease (CKD). Calculated eGFR, using the MRDR Study equation, is useful in  patients with stable renal function. The eGFR calculation will not be reliable in acutely ill patients when serum creatinine is changing rapidly. It is not useful in patients on dialysis. The eGFR calculation may not be applicable to patients at the low and high extremes of  body sizes, pregnant women, and vegetarians.  - Called to [facility] 1/15 @1348  scc  - READ-BACK PROCESS PERFORMED.    GFR calc non Af Amer  Date Value Ref Range Status  01/04/2020 96 >59 mL/min/1.73 Final   eGFR  Date Value Ref Range Status  10/23/2021 90 >59 mL/min/1.73 Final         Failed - Valid encounter within last 6 months    Recent Outpatient Visits           1 year ago Need for influenza vaccination   [place] [facility] Bosie Clos, MD   1 year ago Muscle spasm   [place] [place], [place], PA-C   1 year ago Annual physical exam   [facility] Bosie Clos, MD   2 years ago COVID-19   [facility] Bos[facility] Cl[facility], [facility]   2 years ago Annual physical exam   [facility] Bosie Clos, MD              Failed - CBC within normal  limits and completed in the last 12 months    WBC  Date Value Ref Range Status  10/23/2021 10.0 3.4 - 10.8 x10E3/uL Final  07/16/2014 8.5 4.0 - 10.5 K/uL Final   RBC  Date Value Ref Range Status  10/23/2021 4.95 3.77 - 5.28 x10E6/uL Final  07/16/2014 4.74 3.87 - 5.11 MIL/uL Final   Hemoglobin  Date Value Ref Range Status  10/23/2021 13.7 11.1 - 15.9 g/dL Final   Hematocrit  Date Value Ref Range Status  10/23/2021 41.2 34.0 - 46.6 % Final   MCHC  Date Value Ref Range Status  10/23/2021 33.3 31.5 - 35.7 g/dL Final  84/13/2440 10.2 30.0 - 36.0 g/dL Final   [facility]  Date Value Ref Range Status  10/23/2021 27.7 26.6 - 33.0 pg Final  07/16/2014 28.9 26.0 - 34.0 pg Final   MCV  Date Value Ref Range Status  10/23/2021 83 79 - 97 fL Final   No results found for: "PLTCOUNTKUC", "LABPLAT", "POCPLA" RDW  Date Value Ref Range Status  10/23/2021 13.7 11.7 - 15.4 % Final         Passed - B12 Level in normal range and within 720 days    Vitamin B-12  Date Value Ref Range Status  10/23/2021  640 232 - 1,245 pg/mL Final

## 2023-02-25 NOTE — Telephone Encounter (Signed)
Requested Prescriptions  Pending Prescriptions Disp Refills   montelukast (SINGULAIR) 10 MG tablet [Pharmacy Med Name: MONTELUKAST SOD 10 MG TABLET] 30 tablet 0    Sig: TAKE 1 TABLET BY MOUTH EVERYDAY AT BEDTIME     Pulmonology:  Leukotriene Inhibitors Failed - 02/23/2023 10:29 AM      Failed - Valid encounter within last 12 months    Recent Outpatient Visits           1 year ago Need for influenza vaccination   [facility] Bosie Clos, MD   1 year ago Muscle spasm   [facility] [place], [place], PA-C   1 year ago Annual physical exam   [facility] Bosie [facility], MD   2 years ago COVID-19   [facility] Bos[facility], [facility]   2 years ago Annual physical exam   [facility] Bosie Clos, MD

## 2023-03-05 ENCOUNTER — Other Ambulatory Visit: Payer: Self-pay | Admitting: Family Medicine

## 2023-03-05 DIAGNOSIS — J309 Allergic rhinitis, unspecified: Secondary | ICD-10-CM

## 2023-03-18 ENCOUNTER — Other Ambulatory Visit: Payer: Self-pay | Admitting: Family Medicine

## 2023-03-18 DIAGNOSIS — Z1231 Encounter for screening mammogram for malignant neoplasm of breast: Secondary | ICD-10-CM

## 2023-03-26 ENCOUNTER — Ambulatory Visit
Admission: RE | Admit: 2023-03-26 | Discharge: 2023-03-26 | Disposition: A | Discharge status: Home / Self Care | Payer: BC Managed Care – PPO | Source: Ambulatory Visit | Attending: Family Medicine | Admitting: Family Medicine

## 2023-03-26 DIAGNOSIS — Z1231 Encounter for screening mammogram for malignant neoplasm of breast: Secondary | ICD-10-CM | POA: Diagnosis not present

## 2023-04-16 DIAGNOSIS — D225 Melanocytic nevi of trunk: Secondary | ICD-10-CM | POA: Diagnosis not present

## 2023-04-16 DIAGNOSIS — D2261 Melanocytic nevi of right upper limb, including shoulder: Secondary | ICD-10-CM | POA: Diagnosis not present

## 2023-04-16 DIAGNOSIS — L821 Other seborrheic keratosis: Secondary | ICD-10-CM | POA: Diagnosis not present

## 2023-04-16 DIAGNOSIS — D2262 Melanocytic nevi of left upper limb, including shoulder: Secondary | ICD-10-CM | POA: Diagnosis not present

## 2023-05-13 DIAGNOSIS — J069 Acute upper respiratory infection, unspecified: Secondary | ICD-10-CM | POA: Diagnosis not present

## 2023-05-13 DIAGNOSIS — R232 Flushing: Secondary | ICD-10-CM | POA: Diagnosis not present

## 2023-06-26 ENCOUNTER — Other Ambulatory Visit: Payer: Self-pay | Admitting: Family Medicine

## 2023-06-26 DIAGNOSIS — E119 Type 2 diabetes mellitus without complications: Secondary | ICD-10-CM

## 2023-06-26 DIAGNOSIS — G8929 Other chronic pain: Secondary | ICD-10-CM

## 2023-06-27 NOTE — Telephone Encounter (Signed)
Rx 04/13/22 #360 1RF- needs appointment Requested Prescriptions  Pending Prescriptions Disp Refills   gabapentin (NEURONTIN) 300 MG capsule [Pharmacy Med Name: GABAPENTIN 300 MG CAPSULE] 360 capsule 1    Sig: TAKE 2 CAPSULES BY MOUTH 2 TIMES DAILY.     Neurology: Anticonvulsants - gabapentin Failed - 06/27/2023 12:26 PM      Failed - Cr in normal range and within 360 days    Creatinine  Date Value Ref Range Status  05/28/2014 0.97 0.60 - 1.30 mg/dL Final   Creatinine, Ser  Date Value Ref Range Status  10/23/2021 0.76 0.57 - 1.00 mg/dL Final         Failed - Completed PHQ-2 or PHQ-9 in the last 360 days      Failed - Valid encounter within last 12 months    Recent Outpatient Visits           1 year ago Need for influenza vaccination   [facility] Maple Hudson., MD   1 year ago Muscle spasm   [place] [facility] [place], [place], PA-C   1 year ago Annual physical exam   [facility] Maple Hudson., MD   2 years ago COVID-19   [facility] Maple Hudson., MD   2 years ago Annual physical exam   [facility] Maple Hudson., MD

## 2023-06-27 NOTE — Telephone Encounter (Signed)
Rx 08/29/22 #90- needs appointment Requested Prescriptions  Pending Prescriptions Disp Refills   metFORMIN (GLUCOPHAGE) 500 MG tablet [Pharmacy Med Name: METFORMIN HCL 500 MG TABLET] 90 tablet 0    Sig: TAKE 1 TABLET BY MOUTH EVERY DAY WITH BREAKFAST     Endocrinology:  Diabetes - Biguanides Failed - 06/27/2023 12:25 PM      Failed - Cr in normal range and within 360 days    Creatinine  Date Value Ref Range Status  05/28/2014 0.97 0.60 - 1.30 mg/dL Final   Creatinine, Ser  Date Value Ref Range Status  10/23/2021 0.76 0.57 - 1.00 mg/dL Final         Failed - HBA1C is between 0 and 7.9 and within 180 days    Hemoglobin A1C  Date Value Ref Range Status  12/07/2020 6.3  Final   Hgb A1c MFr Bld  Date Value Ref Range Status  10/23/2021 6.3 (H) 4.8 - 5.6 % Final    Comment:             Prediabetes: 5.7 - 6.4          Diabetes: >6.4          Glycemic control for adults with diabetes: <7.0          Failed - eGFR in normal range and within 360 days    EGFR (African American)  Date Value Ref Range Status  05/28/2014 >60 >43mL/min Final   GFR calc Af Amer  Date Value Ref Range Status  01/04/2020 111 >59 mL/min/1.73 Final    Comment:    **[facility] currently reports eGFR in compliance with the current**   recommendations of the [employer]. [facility] will   update reporting as new guidelines are published from the NKF-ASN   Task force.    EGFR (Non-African Amer.)  Date Value Ref Range Status  05/28/2014 >60 >19mL/min Final    Comment:    eGFR values <27mL/min/1.73 m2 may be an indication of chronic kidney disease (CKD). Calculated eGFR, using the MRDR Study equation, is useful in  patients with stable renal function. The eGFR calculation will not be reliable in acutely ill patients when serum creatinine is changing rapidly. It is not useful in patients on dialysis. The eGFR calculation may not be applicable to patients at the low and high extremes of body sizes,  pregnant women, and vegetarians.  - Called to [facility] 1/15 @1348  scc  - READ-BACK PROCESS PERFORMED.    GFR calc non Af Amer  Date Value Ref Range Status  01/04/2020 96 >59 mL/min/1.73 Final   eGFR  Date Value Ref Range Status  10/23/2021 90 >59 mL/min/1.73 Final         Failed - Valid encounter within last 6 months    Recent Outpatient Visits           1 year ago Need for influenza vaccination   [facility] Maple Hudson., MD   1 year ago Muscle spasm   [place] [place], [place], PA-C   1 year ago Annual physical exam   [facility] Maple Hudson., MD   2 years ago COVID-19   [facility] Maple Hudson., MD   2 years ago Annual physical exam   [facility] Maple Hudson., MD              Failed - CBC within  normal limits and completed in the last 12 months    WBC  Date Value Ref Range Status  10/23/2021 10.0 3.4 - 10.8 x10E3/uL Final  07/16/2014 8.5 4.0 - 10.5 K/uL Final   RBC  Date Value Ref Range Status  10/23/2021 4.95 3.77 - 5.28 x10E6/uL Final  07/16/2014 4.74 3.87 - 5.11 MIL/uL Final   Hemoglobin  Date Value Ref Range Status  10/23/2021 13.7 11.1 - 15.9 g/dL Final   Hematocrit  Date Value Ref Range Status  10/23/2021 41.2 34.0 - 46.6 % Final   MCHC  Date Value Ref Range Status  10/23/2021 33.3 31.5 - 35.7 g/dL Final  96/08/5407 81.1 30.0 - 36.0 g/dL Final   [facility]  Date Value Ref Range Status  10/23/2021 27.7 26.6 - 33.0 pg Final  07/16/2014 28.9 26.0 - 34.0 pg Final   MCV  Date Value Ref Range Status  10/23/2021 83 79 - 97 fL Final   No results found for: "PLTCOUNTKUC", "LABPLAT", "POCPLA" RDW  Date Value Ref Range Status  10/23/2021 13.7 11.7 - 15.4 % Final         Passed - B12 Level in normal range and within 720 days    Vitamin B-12  Date Value Ref Range Status   10/23/2021 640 232 - 1,245 pg/mL Final

## 2023-10-28 ENCOUNTER — Other Ambulatory Visit: Payer: Self-pay | Admitting: Family Medicine

## 2023-10-28 DIAGNOSIS — G8929 Other chronic pain: Secondary | ICD-10-CM

## 2023-11-21 ENCOUNTER — Other Ambulatory Visit: Payer: Self-pay | Admitting: Family Medicine

## 2023-11-21 DIAGNOSIS — G8929 Other chronic pain: Secondary | ICD-10-CM

## 2024-02-20 ENCOUNTER — Encounter: Payer: Self-pay | Admitting: Gastroenterology

## 2024-02-25 ENCOUNTER — Encounter
Admission: RE | Disposition: A | Discharge status: Home / Self Care | Payer: Self-pay | Source: Home / Self Care | Attending: Gastroenterology

## 2024-02-25 ENCOUNTER — Encounter: Payer: Self-pay | Admitting: Gastroenterology

## 2024-02-25 ENCOUNTER — Ambulatory Visit: Payer: Self-pay | Admitting: Anesthesiology

## 2024-02-25 ENCOUNTER — Ambulatory Visit
Admission: RE | Admit: 2024-02-25 | Discharge: 2024-02-25 | Disposition: A | Discharge status: Home / Self Care | Attending: Gastroenterology | Admitting: Gastroenterology

## 2024-02-25 ENCOUNTER — Other Ambulatory Visit: Payer: Self-pay

## 2024-02-25 DIAGNOSIS — Z1211 Encounter for screening for malignant neoplasm of colon: Secondary | ICD-10-CM | POA: Diagnosis present

## 2024-02-25 DIAGNOSIS — K644 Residual hemorrhoidal skin tags: Secondary | ICD-10-CM | POA: Diagnosis not present

## 2024-02-25 DIAGNOSIS — I10 Essential (primary) hypertension: Secondary | ICD-10-CM | POA: Insufficient documentation

## 2024-02-25 DIAGNOSIS — K219 Gastro-esophageal reflux disease without esophagitis: Secondary | ICD-10-CM | POA: Insufficient documentation

## 2024-02-25 DIAGNOSIS — Z83719 Family history of colon polyps, unspecified: Secondary | ICD-10-CM | POA: Insufficient documentation

## 2024-02-25 HISTORY — DX: Flushing: R23.2

## 2024-02-25 HISTORY — DX: Type 2 diabetes mellitus without complications: E11.9

## 2024-02-25 HISTORY — PX: COLONOSCOPY: SHX5424

## 2024-02-25 LAB — GLUCOSE, CAPILLARY: Glucose-Capillary: 100 mg/dL — ABNORMAL HIGH (ref 70–99)

## 2024-02-25 SURGERY — COLONOSCOPY
Anesthesia: General

## 2024-02-25 MED ORDER — LACTATED RINGERS IV SOLN: INTRAVENOUS | Status: DC

## 2024-02-25 MED ORDER — PROPOFOL 10 MG/ML IV BOLUS
INTRAVENOUS | Status: DC | PRN
  Administered 2024-02-25 (×3): 20 mg via INTRAVENOUS
  Administered 2024-02-25: 50 mg via INTRAVENOUS
  Administered 2024-02-25: 20 mg via INTRAVENOUS
  Administered 2024-02-25: 40 mg via INTRAVENOUS
  Administered 2024-02-25: 30 mg via INTRAVENOUS
  Administered 2024-02-25: 20 mg via INTRAVENOUS

## 2024-02-25 MED ORDER — LIDOCAINE HCL (CARDIAC) PF 100 MG/5ML IV SOSY
PREFILLED_SYRINGE | INTRAVENOUS | Status: DC | PRN
  Administered 2024-02-25: 40 mg via INTRATRACHEAL

## 2024-02-25 MED ORDER — STERILE WATER FOR IRRIGATION IR SOLN
Status: DC | PRN
  Administered 2024-02-25: 1

## 2024-02-25 MED ORDER — SODIUM CHLORIDE 0.9 % IV SOLN: INTRAVENOUS | Status: DC

## 2024-02-25 MED ORDER — ONDANSETRON HCL 4 MG/2ML IJ SOLN: 4.0000 mg | Freq: Once | INTRAMUSCULAR | Status: DC

## 2024-02-25 SURGICAL SUPPLY — 4 items
GOWN CVR UNV OPN BCK APRN NK (MISCELLANEOUS) ×2 IMPLANT
KIT PROCEDURE OLYMPUS (MISCELLANEOUS) ×1 IMPLANT
MANIFOLD NEPTUNE II (INSTRUMENTS) ×1 IMPLANT
WATER STERILE IRR 250ML POUR (IV SOLUTION) ×1 IMPLANT

## 2024-02-25 NOTE — H&P (Signed)
 Cheryl JONELLE Brooklyn, MD [facility], [facility] [address]  [place], [place] [address]  Main: [phone] Fax:  [phone] Pager: [phone]   Primary Care Physician:  Bertrum Charlie CROME, MD Primary Gastroenterologist:  Dr. Corinn JONELLE Yang  Pre-Procedure History & Physical: HPI:  Cheryl Yang is a 63 y.o. female is here for an colonoscopy.   Past Medical History:  Diagnosis Date   Anxiety    Diabetes mellitus without complication (HCC)    TYPE 2   Eczema    Environmental and seasonal allergies    GERD (gastroesophageal reflux disease)    occ, uses Prilosec as needed with anti-imflammatories   Hot flashes    Hot flashes    Hypertension    PONV (postoperative nausea and vomiting)     Past Surgical History:  Procedure Laterality Date   BACK SURGERY     lumbar surgery   COLONOSCOPY     EYE SURGERY Left    had hole in eye repaired   VAGINAL HYSTERECTOMY      Prior to Admission medications   Medication Sig Start Date End Date Taking? Authorizing Provider  clotrimazole -betamethasone  (LOTRISONE ) cream Apply 1 application topically 2 (two) times daily. 10/02/17  Yes Bertrum Charlie CROME, MD  Coenzyme Q10 (CO Q10) 100 MG CAPS Take 200 mg by mouth daily.   Yes [provider]  fexofenadine (ALLEGRA) 180 MG tablet Take 180 mg by mouth daily.   Yes [provider]  gabapentin  (NEURONTIN ) 300 MG capsule Take 2 capsules (600 mg total) by mouth 2 (two) times daily. Patient taking differently: Take 300 mg by mouth 3 (three) times daily. 04/13/22  Yes Simmons-Robinson, Makiera, MD  metFORMIN  (GLUCOPHAGE ) 500 MG tablet TAKE 1 TABLET BY MOUTH EVERY DAY WITH BREAKFAST 08/29/22  Yes Gasper Nancyann BRAVO, MD  montelukast  (SINGULAIR ) 10 MG tablet TAKE 1 TABLET BY MOUTH EVERYDAY AT BEDTIME 08/19/22  Yes Simmons-Robinson, Makiera, MD  naproxen sodium (ALEVE) 220 MG tablet Take 220 mg by mouth.   Yes [provider]  rosuvastatin  (CRESTOR ) 5 MG tablet  TAKE 1 TABLET (5 MG TOTAL) BY MOUTH DAILY. 08/02/22  Yes Simmons-Robinson, Makiera, MD  Semaglutide (OZEMPIC, 1 MG/DOSE, [place]) Inject 1 mg into the skin once a week. Last dose 02/15/24   Yes [provider]  sertraline  (ZOLOFT ) 100 MG tablet TAKE 1 TABLET BY MOUTH EVERY DAY 11/09/21  Yes Bertrum Charlie CROME, MD  telmisartan -hydrochlorothiazide  (MICARDIS  HCT) 40-12.5 MG tablet TAKE 1 TABLET BY MOUTH EVERY DAY 08/24/22  Yes Drubel, Manuelita, PA-C  triamcinolone (NASACORT) 55 MCG/ACT AERO nasal inhaler Place into the nose daily.   Yes [provider]  vitamin B-12 (CYANOCOBALAMIN ) 1000 MCG tablet Take 1,000 mcg by mouth daily.   Yes [provider]  cyclobenzaprine  (FLEXERIL ) 5 MG tablet Take 1 tablet (5 mg total) by mouth 3 (three) times daily as needed for muscle spasms. 01/01/22   Bertrum Charlie CROME, MD  glucose blood test strip Check sugar once daily DX E11.9, patient has one touch ultra mini and needs strips and lancets for this 04/13/16   Bertrum Charlie CROME, MD    Allergies as of 02/19/2024 - Review Complete 01/01/2022  Allergen Reaction Noted   Garnell gums allergy] Itching 07/14/2014   Seldane [terfenadine] Rash 06/08/2014   Tagamet [cimetidine] Itching and Rash 06/08/2014    Family History  Problem Relation Age of Onset   Heart attack Mother    CVA Mother    Restless legs syndrome Mother  Breast cancer Neg Hx     Social History   Socioeconomic History   Marital status: Married    Spouse name: Not on file   Number of children: Not on file   Years of education: Not on file   Highest education level: Not on file  Occupational History   Not on file  Tobacco Use   Smoking status: Never   Smokeless tobacco: Never  Vaping Use   Vaping status: Never Used  Substance and Sexual Activity   Alcohol use: Yes    Alcohol/week: 0.0 standard drinks of alcohol    Comment: very rare   Drug use: Never   Sexual activity: Yes    Birth control/protection: Surgical   Other Topics Concern   Not on file  Social History Narrative   Not on file   Social Drivers of Health   Financial Resource Strain: Low Risk  (11/29/2023)   Received from [facility]   Overall Financial Resource Strain (CARDIA)    Difficulty of Paying Living Expenses: Not hard at all  Food Insecurity: No Food Insecurity (11/29/2023)   Received from [facility]   Hunger Vital Sign    Within the past 12 months, you worried that your food would run out before you got the money to buy more.: Never true    Within the past 12 months, the food you bought just didn't last and you didn't have money to get more.: Never true  Transportation Needs: No Transportation Needs (11/29/2023)   Received from [facility]    In the past 12 months, has lack of transportation kept you from medical appointments or from getting medications?: No    Lack of Transportation (Non-Medical): No  Physical Activity: Sufficiently Active (05/23/2023)   Received from [facility]   Exercise Vital Sign    On average, how many days per week do you engage in moderate to strenuous exercise (like a brisk walk)?: 5 days    On average, how many minutes do you engage in exercise at this level?: 50 min  Stress: Not on file  Social Connections: Not on file  Intimate Partner Violence: Not on file    Review of Systems: See HPI, otherwise negative ROS  Physical Exam: BP 124/61   Pulse 69   Temp 98.4 F (36.9 C) (Temporal)   Resp 16   Ht 5' 5 (1.651 m)   Wt 87.5 kg   SpO2 93%   BMI 32.12 kg/m  General:   Alert,  pleasant and cooperative in NAD Head:  Normocephalic and atraumatic. Neck:  Supple; no masses or thyromegaly. Lungs:  Clear throughout to auscultation.    Heart:  Regular rate and rhythm. Abdomen:  Soft, nontender and nondistended. Normal bowel sounds, without guarding, and without rebound.   Neurologic:  Alert and   oriented x4;  grossly normal neurologically.  Impression/Plan: Cheryl Yang is here for an colonoscopy to be performed for colon cancer screening  Risks, benefits, limitations, and alternatives regarding  colonoscopy have been reviewed with the patient.  Questions have been answered.  All parties agreeable.   Cheryl Brooklyn, MD  02/25/2024, 9:26 AM

## 2024-02-25 NOTE — Anesthesia Postprocedure Evaluation (Signed)
 Anesthesia Post Note  Patient: Cheryl Yang  Procedure(s) Performed: COLONOSCOPY  Patient location during evaluation: PACU Anesthesia Type: General Level of consciousness: awake and alert Pain management: pain level controlled Vital Signs Assessment: post-procedure vital signs reviewed and stable Respiratory status: spontaneous breathing, nonlabored ventilation, respiratory function stable and patient connected to nasal cannula oxygen Cardiovascular status: blood pressure returned to baseline and stable Postop Assessment: no apparent nausea or vomiting Anesthetic complications: no   No notable events documented.   Last Vitals:  Vitals:   02/25/24 1021 02/25/24 1030  BP:  (!) 110/57  Pulse: 72 70  Resp: (!) 25 12  Temp: 36.8 C   SpO2: 97% 98%    Last Pain:  Vitals:   02/25/24 1030  TempSrc:   PainSc: 0-No pain                 Tuff Clabo C Kiele Heavrin

## 2024-02-25 NOTE — Anesthesia Preprocedure Evaluation (Signed)
 Anesthesia Evaluation  Patient identified by MRN, date of birth, ID band Patient awake    Reviewed: Allergy & Precautions, H&P , NPO status , Patient's Chart, lab work & pertinent test results  History of Anesthesia Complications (+) PONV and history of anesthetic complications  Airway Mallampati: III  TM Distance: <3 FB Neck ROM: Full    Dental no notable dental hx.    Pulmonary neg pulmonary ROS   Pulmonary exam normal breath sounds clear to auscultation       Cardiovascular hypertension, negative cardio ROS Normal cardiovascular exam Rhythm:Regular Rate:Normal     Neuro/Psych  PSYCHIATRIC DISORDERS Anxiety Depression     Neuromuscular disease negative neurological ROS  negative psych ROS   GI/Hepatic negative GI ROS, Neg liver ROS,GERD  ,,  Endo/Other  negative endocrine ROSdiabetes    Renal/GU negative Renal ROS  negative genitourinary   Musculoskeletal negative musculoskeletal ROS (+)    Abdominal   Peds negative pediatric ROS (+)  Hematology negative hematology ROS (+)   Anesthesia Other Findings Medical History  Hypertension Anxiety GERD (gastroesophageal reflux disease) Environmental and seasonal allergies Eczema  PONV (postoperative nausea and vomiting) patient requests preop zofran   Diabetes mellitus without complication (HCC) Hot flashes Hot flashes     Reproductive/Obstetrics negative OB ROS                              Anesthesia Physical Anesthesia Plan  ASA: 2  Anesthesia Plan: General   Post-op Pain Management:    Induction: Intravenous  PONV Risk Score and Plan:   Airway Management Planned: Natural Airway and Nasal Cannula  Additional Equipment:   Intra-op Plan:   Post-operative Plan:   Informed Consent: I have reviewed the patients History and Physical, chart, labs and discussed the procedure including the risks, benefits and alternatives for  the proposed anesthesia with the patient or authorized representative who has indicated his/her understanding and acceptance.     Dental Advisory Given  Plan Discussed with: Anesthesiologist, CRNA and Surgeon  Anesthesia Plan Comments: (Patient consented for risks of anesthesia including but not limited to:  - adverse reactions to medications - risk of airway placement if required - damage to eyes, teeth, lips or other oral mucosa - nerve damage due to positioning  - sore throat or hoarseness - Damage to heart, brain, nerves, lungs, other parts of body or loss of life  Patient voiced understanding and assent.)         Anesthesia Quick Evaluation

## 2024-02-25 NOTE — Op Note (Signed)
 [facility] Patient Name: Cheryl Yang Procedure Date: 02/25/2024 9:42 AM MRN: [id number] Account #: [id number] Date of Birth: 1961-04-28 Admit Type: Outpatient Age: 63 Room: [facility] OR ROOM 01 Gender: Female Note Status: Finalized Instrument Name: Arvis [id number] Procedure:             Colonoscopy Indications:           Screening for colorectal malignant neoplasm, This is                         the patient's first colonoscopy Providers:             Corinn Jess Brooklyn MD, MD Referring MD:          Charlie CROME. Bertrum, MD (Referring MD) Medicines:             General Anesthesia Complications:         No immediate complications. Estimated blood loss: None. Procedure:             Pre-Anesthesia Assessment:                        - Prior to the procedure, a History and Physical was                         performed, and patient medications and allergies were                         reviewed. The patient is competent. The risks and                         benefits of the procedure and the sedation options and                         risks were discussed with the patient. All questions                         were answered and informed consent was obtained.                         Patient identification and proposed procedure were                         verified by the physician, the nurse, the                         anesthesiologist, the anesthetist and the technician                         in the pre-procedure area in the procedure room in the                         endoscopy suite. Mental Status Examination: alert and                         oriented. Airway Examination: normal oropharyngeal                         airway and neck mobility. Respiratory Examination:  clear to auscultation. CV Examination: normal.                         Prophylactic Antibiotics: The patient does not require                          prophylactic antibiotics. Prior Anticoagulants: The                         patient has taken no anticoagulant or antiplatelet                         agents. ASA Grade Assessment: II - A patient with mild                         systemic disease. After reviewing the risks and                         benefits, the patient was deemed in satisfactory                         condition to undergo the procedure. The anesthesia                         plan was to use general anesthesia. Immediately prior                         to administration of medications, the patient was                         re-assessed for adequacy to receive sedatives. The                         heart rate, respiratory rate, oxygen saturations,                         blood pressure, adequacy of pulmonary ventilation, and                         response to care were monitored throughout the                         procedure. The physical status of the patient was                         re-assessed after the procedure.                        After obtaining informed consent, the colonoscope was                         passed under direct vision. Throughout the procedure,                         the patient's blood pressure, pulse, and oxygen                         saturations were monitored continuously. The  Colonoscope was introduced through the anus and                         advanced to the the terminal ileum, with                         identification of the appendiceal orifice and IC                         valve. The colonoscopy was performed without                         difficulty. The patient tolerated the procedure well.                         The quality of the bowel preparation was evaluated                         using the BBPS [facility] Bowel Preparation Scale) with                         scores of: Right Colon = 3, Transverse Colon = 3 and                         Left  Colon = 3 (entire mucosa seen well with no                         residual staining, small fragments of stool or opaque                         liquid). The total BBPS score equals 9. The terminal                         ileum, ileocecal valve, appendiceal orifice, and                         rectum were photographed. Findings:      The perianal and digital rectal examinations were normal. Pertinent       negatives include normal sphincter tone and no palpable rectal lesions.      The entire examined colon appeared normal.      Non-bleeding external hemorrhoids were found during retroflexion. The       hemorrhoids were medium-sized. Impression:            - The entire examined colon is normal.                        - Non-bleeding external hemorrhoids.                        - No specimens collected. Recommendation:        - Discharge patient to home (with escort).                        - Resume previous diet today.                        - Continue present medications.                        -  Repeat colonoscopy in 10 years for screening                         purposes. Procedure Code(s):     --- Professional ---                        H9878, Colorectal cancer screening; colonoscopy on                         individual not meeting criteria for high risk Diagnosis Code(s):     --- Professional ---                        Z12.11, Encounter for screening for malignant neoplasm                         of colon                        K64.4, Residual hemorrhoidal skin tags CPT copyright 2022 American Medical Association. All rights reserved. The codes documented in this report are preliminary and upon coder review may  be revised to meet current compliance requirements. Dr. Corinn Brooklyn Corinn Jess Brooklyn MD, MD 02/25/2024 10:19:01 AM This report has been signed electronically. Number of Addenda: 0 Note Initiated On: 02/25/2024 9:42 AM Scope Withdrawal Time: 0 hours 8 minutes 55  seconds  Total Procedure Duration: 0 hours 14 minutes 45 seconds  Estimated Blood Loss:  Estimated blood loss: none.      [facility]

## 2024-02-25 NOTE — Transfer of Care (Signed)
 Immediate Anesthesia Transfer of Care Note  Patient: Cheryl Yang  Procedure(s) Performed: COLONOSCOPY  Patient Location: PACU  Anesthesia Type: General  Level of Consciousness: awake, alert  and patient cooperative  Airway and Oxygen Therapy: Patient Spontanous Breathing and Patient connected to supplemental oxygen  Post-op Assessment: Post-op Vital signs reviewed, Patient's Cardiovascular Status Stable, Respiratory Function Stable, Patent Airway and No signs of Nausea or vomiting  Post-op Vital Signs: Reviewed and stable  Complications: No notable events documented.

## 2024-02-26 ENCOUNTER — Encounter: Payer: Self-pay | Admitting: Gastroenterology
